# Patient Record
Sex: Male | Born: 1950
Health system: Southern US, Community
[De-identification: ages and names within clinical notes are randomized; demographics above are authoritative.]

## PROBLEM LIST (undated history)

## (undated) DIAGNOSIS — I2510 Atherosclerotic heart disease of native coronary artery without angina pectoris: Secondary | ICD-10-CM

## (undated) DIAGNOSIS — T4145XA Adverse effect of unspecified anesthetic, initial encounter: Secondary | ICD-10-CM

## (undated) DIAGNOSIS — M1990 Unspecified osteoarthritis, unspecified site: Secondary | ICD-10-CM

## (undated) DIAGNOSIS — Z955 Presence of coronary angioplasty implant and graft: Secondary | ICD-10-CM

## (undated) DIAGNOSIS — Z87442 Personal history of urinary calculi: Secondary | ICD-10-CM

## (undated) DIAGNOSIS — I10 Essential (primary) hypertension: Secondary | ICD-10-CM

## (undated) DIAGNOSIS — K5730 Diverticulosis of large intestine without perforation or abscess without bleeding: Secondary | ICD-10-CM

## (undated) DIAGNOSIS — E785 Hyperlipidemia, unspecified: Secondary | ICD-10-CM

## (undated) DIAGNOSIS — K649 Unspecified hemorrhoids: Secondary | ICD-10-CM

## (undated) DIAGNOSIS — I00 Rheumatic fever without heart involvement: Secondary | ICD-10-CM

## (undated) DIAGNOSIS — T8859XA Other complications of anesthesia, initial encounter: Secondary | ICD-10-CM

## (undated) DIAGNOSIS — K219 Gastro-esophageal reflux disease without esophagitis: Secondary | ICD-10-CM

## (undated) DIAGNOSIS — D179 Benign lipomatous neoplasm, unspecified: Secondary | ICD-10-CM

## (undated) DIAGNOSIS — J309 Allergic rhinitis, unspecified: Secondary | ICD-10-CM

## (undated) HISTORY — DX: Atherosclerotic heart disease of native coronary artery without angina pectoris: I25.10

## (undated) HISTORY — PX: TONSILLECTOMY AND ADENOIDECTOMY: SUR1326

## (undated) HISTORY — DX: Hyperlipidemia, unspecified: E78.5

## (undated) HISTORY — DX: Unspecified hemorrhoids: K64.9

## (undated) HISTORY — PX: NASAL SEPTUM SURGERY: SHX37

## (undated) HISTORY — DX: Essential (primary) hypertension: I10

## (undated) HISTORY — DX: Rheumatic fever without heart involvement: I00

## (undated) HISTORY — DX: Diverticulosis of large intestine without perforation or abscess without bleeding: K57.30

## (undated) HISTORY — DX: Allergic rhinitis, unspecified: J30.9

---
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
## 1997-11-13 ENCOUNTER — Other Ambulatory Visit: Admission: RE | Admit: 1997-11-13 | Discharge: 1997-11-13 | Payer: Self-pay | Admitting: *Deleted

## 1998-01-29 ENCOUNTER — Other Ambulatory Visit: Admission: RE | Admit: 1998-01-29 | Discharge: 1998-01-29 | Payer: Self-pay | Admitting: *Deleted

## 1998-02-04 ENCOUNTER — Ambulatory Visit (HOSPITAL_COMMUNITY): Admission: RE | Admit: 1998-02-04 | Discharge: 1998-02-04 | Payer: Self-pay | Admitting: *Deleted

## 1998-02-12 ENCOUNTER — Other Ambulatory Visit: Admission: RE | Admit: 1998-02-12 | Discharge: 1998-02-12 | Payer: Self-pay | Admitting: *Deleted

## 2000-06-28 ENCOUNTER — Ambulatory Visit (HOSPITAL_COMMUNITY): Admission: RE | Admit: 2000-06-28 | Discharge: 2000-06-28 | Payer: Self-pay | Admitting: *Deleted

## 2001-11-21 ENCOUNTER — Encounter: Admission: RE | Admit: 2001-11-21 | Discharge: 2001-11-21 | Payer: Self-pay | Admitting: Family Medicine

## 2001-11-21 ENCOUNTER — Encounter: Payer: Self-pay | Admitting: Family Medicine

## 2002-12-31 ENCOUNTER — Emergency Department (HOSPITAL_COMMUNITY): Admission: EM | Admit: 2002-12-31 | Discharge: 2002-12-31 | Payer: Self-pay | Admitting: Emergency Medicine

## 2002-12-31 ENCOUNTER — Encounter: Payer: Self-pay | Admitting: Emergency Medicine

## 2004-06-10 DIAGNOSIS — K5730 Diverticulosis of large intestine without perforation or abscess without bleeding: Secondary | ICD-10-CM

## 2004-06-10 HISTORY — DX: Diverticulosis of large intestine without perforation or abscess without bleeding: K57.30

## 2004-08-10 ENCOUNTER — Emergency Department (HOSPITAL_COMMUNITY): Admission: EM | Admit: 2004-08-10 | Discharge: 2004-08-10 | Payer: Self-pay | Admitting: Emergency Medicine

## 2004-08-26 ENCOUNTER — Ambulatory Visit (HOSPITAL_COMMUNITY): Admission: RE | Admit: 2004-08-26 | Discharge: 2004-08-26 | Payer: Self-pay | Admitting: Urology

## 2005-06-28 ENCOUNTER — Emergency Department (HOSPITAL_COMMUNITY): Admission: EM | Admit: 2005-06-28 | Discharge: 2005-06-28 | Payer: Self-pay | Admitting: Emergency Medicine

## 2009-05-19 ENCOUNTER — Ambulatory Visit (HOSPITAL_COMMUNITY): Admission: RE | Admit: 2009-05-19 | Discharge: 2009-05-19 | Payer: Self-pay | Admitting: Internal Medicine

## 2009-05-19 ENCOUNTER — Encounter (INDEPENDENT_AMBULATORY_CARE_PROVIDER_SITE_OTHER): Payer: Self-pay | Admitting: *Deleted

## 2010-12-31 NOTE — Consult Note (Signed)
NAME:  [name], George NO.:  [id number]   MEDICAL RECORD NO.:  [id number]          PATIENT TYPE:  EMS   LOCATION:  MAJO                         FACILITY:  [facility]   PHYSICIAN:  Antony Contras, MD     DATE OF BIRTH:  17-Jan-1951   DATE OF CONSULTATION:  06/28/2005  DATE OF DISCHARGE:                                   CONSULTATION   CHIEF COMPLAINT:  Epistaxis.   HISTORY OF PRESENT ILLNESS:  Mr. Roberto George is a 60 year old white male who  underwent septoplasty and turbinate reduction a little over a week ago for  nasal obstruction.  His stents were removed on Friday.  Through the weekend,  he has had several episodes of epistaxis, mainly from the left side, and a  few of them being pretty severe, lasting up to 15 minutes.  He talked to the  doctor on call last night who suggested that he come to the emergency room  to be evaluated.  In the emergency room, he has no specific complaints  except for the bleeding as described.   PAST MEDICAL HISTORY:  Anxiety, high cholesterol, heart murmur.   PAST SURGICAL HISTORY:  As above.   MEDICATIONS:  Lipitor and Zoloft.   ALLERGIES:  Erythromycin.   SOCIAL HISTORY:  Denies alcohol or drug use.  He also denies smoking.  He is  in the emergency department with his wife and daughter.   REVIEW OF SYSTEMS:  Otherwise, negative.   PHYSICAL EXAMINATION:  VITAL SIGNS:  Temperature 97.1, blood pressure 140/90, pulse 76,  respirations 18, saturation 99%.  GENERAL:  The patient is in no acute distress, he is pleasant and  cooperative with dry blood on the face.  HEENT:  Nasal exam, there is no active epistaxis at this time.  Anterior  exam reveals a little bit of clots in the nasal floor on the left side.  Oral cavity and oropharynx exam, there is no active bleeding down the  oropharynx.  The patient has a long soft palate and under bite.  NECK:  Unremarkable with no masses or adenopathy.   PROCEDURE:  The nose was sprayed on both  sides heavily with Afrin nasal  spray.  A nasal speculum was used to evaluate the anterior nose and a  Frazier tip suction used to suction out some of the clot from the left side.  A flexible laryngoscope was then passed into the nasal passages to evaluate  for active bleeding and none was seen.  Additional clot was suctioned out as  well as spit out by the patient.  Evaluation, again, with the flexible scope  revealed no active bleeding.   ASSESSMENT:  Mr. Roberto George is a 60 year old white male who has postoperative  epistaxis following nasal surgery.   PLAN:  Currently, there is no active bleeding and nowhere to cauterize or  pack.  I have recommended that he use Afrin nasal spray four times daily for  three days and saline nasal spray inbetween.  I have instructed him to call  our office right away if he has an active bleed again and  he will be re-  evaluated for possible intervention.  He is stable to be discharged from the  emergency department and understands the instructions.      Antony Contras, MD  Electronically Signed     DDB/MEDQ  D:  06/28/2005  T:  06/28/2005  Job:  [id number]

## 2011-01-04 ENCOUNTER — Other Ambulatory Visit (HOSPITAL_COMMUNITY): Payer: Self-pay | Admitting: Internal Medicine

## 2011-01-04 ENCOUNTER — Ambulatory Visit (HOSPITAL_COMMUNITY)
Admission: RE | Admit: 2011-01-04 | Discharge: 2011-01-04 | Disposition: A | Discharge status: Home / Self Care | Payer: BC Managed Care – PPO | Source: Ambulatory Visit | Attending: Internal Medicine | Admitting: Internal Medicine

## 2011-01-04 DIAGNOSIS — R102 Pelvic and perineal pain: Secondary | ICD-10-CM

## 2011-01-04 DIAGNOSIS — R109 Unspecified abdominal pain: Secondary | ICD-10-CM | POA: Insufficient documentation

## 2011-01-04 DIAGNOSIS — R1031 Right lower quadrant pain: Secondary | ICD-10-CM

## 2012-08-24 ENCOUNTER — Telehealth: Payer: Self-pay | Admitting: Gastroenterology

## 2012-08-24 NOTE — Telephone Encounter (Signed)
Left message for pt to call back  °

## 2012-08-24 NOTE — Telephone Encounter (Signed)
Pt c/o pain from hemorrhoids. Requesting to be seen sooner than 1st available. Pt schedule to see Mike Gip PA 08/28/12@10am . Pt aware of appt date and time.

## 2012-08-27 ENCOUNTER — Encounter: Payer: Self-pay | Admitting: *Deleted

## 2012-08-28 ENCOUNTER — Encounter: Payer: Self-pay | Admitting: Physician Assistant

## 2012-08-28 ENCOUNTER — Ambulatory Visit (INDEPENDENT_AMBULATORY_CARE_PROVIDER_SITE_OTHER): Payer: BC Managed Care – PPO | Admitting: Physician Assistant

## 2012-08-28 VITALS — BP 130/86 | HR 73 | Ht 70.5 in | Wt 190.8 lb

## 2012-08-28 DIAGNOSIS — I10 Essential (primary) hypertension: Secondary | ICD-10-CM

## 2012-08-28 DIAGNOSIS — K648 Other hemorrhoids: Secondary | ICD-10-CM

## 2012-08-28 DIAGNOSIS — K219 Gastro-esophageal reflux disease without esophagitis: Secondary | ICD-10-CM

## 2012-08-28 DIAGNOSIS — K5790 Diverticulosis of intestine, part unspecified, without perforation or abscess without bleeding: Secondary | ICD-10-CM

## 2012-08-28 DIAGNOSIS — K5730 Diverticulosis of large intestine without perforation or abscess without bleeding: Secondary | ICD-10-CM

## 2012-08-28 DIAGNOSIS — E785 Hyperlipidemia, unspecified: Secondary | ICD-10-CM

## 2012-08-28 MED ORDER — LIDOCAINE HCL 3 % EX CREA: TOPICAL_CREAM | CUTANEOUS | Status: DC

## 2012-08-28 MED ORDER — LIDOCAINE HCL 3 % EX CREA: 1.0000 "application " | TOPICAL_CREAM | CUTANEOUS | Status: DC | PRN

## 2012-08-28 NOTE — Patient Instructions (Addendum)
We sent a prescription for Lidamantle cream to [facility].  Get BaLmex cream over the counter, use 2-3 times daily. Use moistened wipes to clean the rectal area after bowel movements.   Follow up with Dr Arlyce Dice on 10-10-2012 at 9:45 Am.

## 2012-08-28 NOTE — Progress Notes (Signed)
Subjective:    Patient ID: Roberto George, male    DOB: [birth year], 62 y.o.   MRN: [id number]  HPI  Roberto George is a very nice 62 year old white male, known remotely to Dr. Arlyce Dice from colonoscopy done in October of 2005. This was done as a screening exam and showed evidence of left-sided diverticulosis and was otherwise a normal exam. He comes back in today with complaints of rectal discomfort and bleeding. He says he has had hemorrhoidal symptoms off and on over the past several years but has been having problems over the past 4-5 months but have been more persistent and and aggravating. He denies any abdominal pain or change in his bowel habits, no problems with constipation or excessive straining. He says he has pain with a bowel movement and says it feels like a piece of glass going through his rectum and times, he is also bothered by external itching and burning and has had frequent bleeding with blood dripping in the commode after a bowel movement . He has been using over-the-counter hydrocortisone cream which is somewhat helpful. He was advised to do tub  soaks but this does not seem to be making any difference .  Family history is negative for colon cancer/ polyps.    Review of Systems  Constitutional: Negative.   HENT: Negative.   Eyes: Negative.   Respiratory: Negative.   Cardiovascular: Negative.   Gastrointestinal: Positive for blood in stool and rectal pain.  Genitourinary: Negative.   Musculoskeletal: Negative.   Neurological: Negative.   Hematological: Negative.   Psychiatric/Behavioral: Negative.    Outpatient Encounter Prescriptions as of 08/28/2012  Medication Sig Dispense Refill  . atorvastatin (LIPITOR) 40 MG tablet Take 40 mg by mouth daily.      . cholecalciferol (VITAMIN D) 400 UNITS TABS Take 400 Units by mouth daily.      . cyclobenzaprine (FLEXERIL) 10 MG tablet Take 10 mg by mouth as needed.      . fluticasone (VERAMYST) 27.5 MCG/SPRAY nasal spray Place 2 sprays  into the nose as needed.      . folic acid (FOLVITE) 800 MCG tablet Take 400 mcg by mouth daily.      Marland Kitchen lisinopril (PRINIVIL,ZESTRIL) 40 MG tablet Take 40 mg by mouth daily.      Marland Kitchen omeprazole (PRILOSEC) 20 MG capsule Take 20 mg by mouth daily.      . sertraline (ZOLOFT) 50 MG tablet Take 50 mg by mouth daily.      Marland Kitchen lidocaine (LINDAMANTLE) 3 % CREA cream Use a small amount internally 2-3 times daily.  1 Tube  3  . [DISCONTINUED] lidocaine (LINDAMANTLE) 3 % CREA cream Apply 1 application topically as needed.  1 Tube  3   Allergies  Allergen Reactions  . Erythromycin      Patient Active Problem List  Diagnosis  . Diverticulosis  . GERD (gastroesophageal reflux disease)  . HTN (hypertension)  . Hyperlipidemia   family history includes Heart attack in his maternal grandmother and mother and Stroke in his father. History  Substance Use Topics  . Smoking status: Former Smoker    Quit date: 08/15/1970  . Smokeless tobacco: Never Used  . Alcohol Use: Yes     Comment: average 4-5 drinks a day - drinks mostly on weekend        Physical Exam  well-developed older white male in no acute distress, pleasant blood pressure 130/86 pulse 73 height 5 foot 10 weight 190. HEENT; nontraumatic normocephalic EOMI PERRLA sclera  anicteric,Neck; Supple no JVD, Cardiovascular; regular rate and rhythm with S1-S2 no murmur gallop, Pulmonary ;clear bilaterally, Abdomen; soft nontender nondistended bowel sounds active no palpable mass or hepatosplenomegaly, Rectal; exam small external hemorrhoidal tag no evidence for candidiasis etc., on anoscopy he has at least one internal hemorrhoid inflamed and small area of ulceration with oozing of heme . Extremities; no clubbing cyanosis or edema skin warm and dry, Psych; mood and affect normal and appropriate.        Assessment & Plan:  #38  62 year old white male with symptomatic internal hemorrhoids manifested with rectal discomfort and intermittent bleeding . #2  colon neoplasia surveillance-patient is up-to-date with colonoscopy, no polyps on initial screening exam and due for followup in 2015  Plan; we'll give him a trial of bulb max/zinc oxide cream for external use 2-3 times daily Moistened tissue Start Analpram 2.5%, patient to use after each bowel movement and then 2-3 times daily regularly over the next 3-4 weeks, and when necessary thereafter He'll followup with Dr. Arlyce Dice in 5-6 weeks if he has not had significant improvement with the above regimen . We briefly discussed hemorrhoidal banding.

## 2012-08-29 NOTE — Progress Notes (Signed)
Reviewed and agree with management. Skilynn Durney D. Aanika Defoor, M.D., FACG  

## 2012-10-10 ENCOUNTER — Ambulatory Visit: Payer: BC Managed Care – PPO | Admitting: Gastroenterology

## 2014-03-14 ENCOUNTER — Encounter (INDEPENDENT_AMBULATORY_CARE_PROVIDER_SITE_OTHER): Payer: Self-pay | Admitting: General Surgery

## 2014-03-20 ENCOUNTER — Ambulatory Visit (INDEPENDENT_AMBULATORY_CARE_PROVIDER_SITE_OTHER): Payer: Medicare Other | Admitting: General Surgery

## 2014-03-20 ENCOUNTER — Encounter (INDEPENDENT_AMBULATORY_CARE_PROVIDER_SITE_OTHER): Payer: Self-pay | Admitting: General Surgery

## 2014-03-20 VITALS — BP 128/78 | HR 66 | Temp 97.2°F | Ht 71.0 in | Wt 192.0 lb

## 2014-03-20 DIAGNOSIS — R229 Localized swelling, mass and lump, unspecified: Secondary | ICD-10-CM

## 2014-03-20 DIAGNOSIS — R222 Localized swelling, mass and lump, trunk: Secondary | ICD-10-CM

## 2014-03-20 DIAGNOSIS — R220 Localized swelling, mass and lump, head: Secondary | ICD-10-CM

## 2014-03-20 NOTE — Progress Notes (Signed)
Subjective:   Mass on the back and on scalp  Patient ID: Roberto George, male   DOB: 01/30/1951, 63 y.o.   MRN: [id number]  HPI Patient is a pleasant 63 year old male referred by Dr. Tonia Brooms for a soft tissue mass on his back. The patient was not aware of the area until recently. He was seeing a chiropractor for neck pain it was pointed out to him that he had a mass on his upper mid back toward the base of the neck. He then was able to feel it. He saw Dr. Tonia Brooms who also noted that he was concerned and he was referred. Based on the size currently he feels that it is probably grown fairly rapidly. He also has a soft lump on the back of his scalp that has been present a couple of years but has definitely enlarged And has become tender  Past Medical History  Diagnosis Date  . Diverticulosis of colon (without mention of hemorrhage) 06/10/2004    Dr Erskine Emery  . Hemorrhoids   . Hypertension   . Hyperlipidemia   . Allergic rhinitis   . Rheumatic fever   . Heart murmur    Past Surgical History  Procedure Laterality Date  . Nasal septum surgery    . Tonsillectomy and adenoidectomy     Current Outpatient Prescriptions  Medication Sig Dispense Refill  . atorvastatin (LIPITOR) 40 MG tablet Take 40 mg by mouth daily.      . cholecalciferol (VITAMIN D) 400 UNITS TABS Take 400 Units by mouth daily.      . cyclobenzaprine (FLEXERIL) 10 MG tablet Take 10 mg by mouth as needed.      . fluticasone (VERAMYST) 27.5 MCG/SPRAY nasal spray Place 2 sprays into the nose as needed.      . folic acid (FOLVITE) 947 MCG tablet Take 400 mcg by mouth daily.      Marland Kitchen lidocaine (LINDAMANTLE) 3 % CREA cream Use a small amount internally 2-3 times daily.  1 Tube  3  . lisinopril (PRINIVIL,ZESTRIL) 40 MG tablet Take 40 mg by mouth daily.      Marland Kitchen omeprazole (PRILOSEC) 20 MG capsule Take 20 mg by mouth daily.      . sertraline (ZOLOFT) 50 MG tablet Take 50 mg by mouth daily.       No current facility-administered  medications for this visit.   Allergies  Allergen Reactions  . Erythromycin    History  Substance Use Topics  . Smoking status: Former Smoker    Quit date: 08/15/1970  . Smokeless tobacco: Never Used  . Alcohol Use: Yes     Comment: average 4-5 drinks a day - drinks mostly on weekend     Review of Systems  Respiratory: Negative.   Cardiovascular: Negative.   Musculoskeletal: Positive for arthralgias, neck pain and neck stiffness.       Objective:   Physical Exam BP 128/78  Pulse 66  Temp(Src) 97.2 F (36.2 C)  Ht 5\' 11"  (1.803 m)  Wt 192 lb (87.091 kg)  BMI 26.79 kg/m2 General: Well-developed male in no distress HEENT: On the posterior scalp near the midline is a 3 cm soft fairly well-defined subcutaneous mass. No other masses. Lymph nodes no cervical, supraclavicular nodes palpable Trunk: On the upper back near the midline is a 7 cm rubbery somewhat firm and deep subcutaneous mass. Lungs: Clear equal breath sounds bilaterally Cardiac: Regular rate and rhythm. No edema Neurologic: Alert and oriented. Gait normal.    Assessment:  Deep subcutaneous masses of the scalp and back, 3 and 7 cm respectively. He is having symptoms and likely some significant recent enlargement. Most likely lipomas but with enlargement of symptoms I think he should be removed, rule out malignant tumor such as liposarcoma. We discussed the nature and indications of the surgery. Risks of general anesthesia, bleeding, infection were discussed and understood and all his questions answered     Plan:     Excision of soft tissue masses of scalp and back under general anesthesia as an outpatient

## 2014-03-20 NOTE — Patient Instructions (Signed)
Lipoma  A lipoma is a noncancerous (benign) tumor composed of fat cells. They are usually found under the skin (subcutaneous). A lipoma may occur in any tissue of the body that contains fat. Common areas for lipomas to appear include the back, shoulders, buttocks, and thighs. Lipomas are a very common soft tissue growth. They are soft and grow slowly. Most problems caused by a lipoma depend on where it is growing.  DIAGNOSIS   A lipoma can be diagnosed with a physical exam. These tumors rarely become cancerous, but radiographic studies can help determine this for certain. Studies used may include:  · Computerized X-ray scans (CT or CAT scan).  · Computerized magnetic scans (MRI).  TREATMENT   Small lipomas that are not causing problems may be watched. If a lipoma continues to enlarge or causes problems, removal is often the best treatment. Lipomas can also be removed to improve appearance. Surgery is done to remove the fatty cells and the surrounding capsule. Most often, this is done with medicine that numbs the area (local anesthetic). The removed tissue is examined under a microscope to make sure it is not cancerous. Keep all follow-up appointments with your caregiver.  SEEK MEDICAL CARE IF:   · The lipoma becomes larger or hard.  · The lipoma becomes painful, red, or increasingly swollen. These could be signs of infection or a more serious condition.  Document Released: 07/22/2002 Document Revised: 10/24/2011 Document Reviewed: 01/01/2010  ExitCare® Patient Information ©2015 [employer]. This information is not intended to replace advice given to you by your health care provider. Make sure you discuss any questions you have with your health care provider.

## 2014-04-03 ENCOUNTER — Encounter: Payer: Self-pay | Admitting: Gastroenterology

## 2014-04-03 NOTE — Progress Notes (Signed)
Surgery 04-08-14, pre op 04-07-14, please put orders in Epic Thanks

## 2014-04-04 ENCOUNTER — Encounter (HOSPITAL_COMMUNITY): Payer: Self-pay | Admitting: Pharmacy Technician

## 2014-04-04 ENCOUNTER — Ambulatory Visit (INDEPENDENT_AMBULATORY_CARE_PROVIDER_SITE_OTHER): Payer: Medicare Other | Admitting: General Surgery

## 2014-04-04 NOTE — Patient Instructions (Addendum)
Roberto George  04/04/2014                           YOUR PROCEDURE IS SCHEDULED ON: 04/08/14               ENTER THRU [facility] MAIN HOSPITAL ENTRANCE AND                            FOLLOW  SIGNS TO SHORT STAY CENTER                 ARRIVE AT SHORT STAY AT: 7:30 AM               CALL THIS NUMBER IF ANY PROBLEMS THE DAY OF SURGERY :               [phone]                                REMEMBER:   Do not eat food or drink liquids AFTER MIDNIGHT              STOP ASPIRIN AND HERBAL MEDS 5 DAYS PREOP                  Take these medicines the morning of surgery with               A SIPS OF WATER :   NEXIUM      Do not wear jewelry, make-up   Do not wear lotions, powders, or perfumes.   Do not shave legs or underarms 12 hrs. before surgery (men may shave face)  Do not bring valuables to the hospital.  Contacts, dentures or bridgework may not be worn into surgery.  Leave suitcase in the car. After surgery it may be brought to your room.  For patients admitted to the hospital more than one night, checkout time is            11:00 AM                                                       The day of discharge.   Patients discharged the day of surgery will not be allowed to drive home.            If going home same day of surgery, must have someone stay with you              FIRST 24 hrs at home and arrange for some one to drive you              home from hospital.   ________________________________________________________________________                                                                        [place]  Before surgery, you can play an important role.  Because skin is not sterile, your skin needs  to be as free of germs as possible.  You can reduce the number of germs on your skin by washing with CHG (chlorahexidine gluconate) soap before surgery.  CHG is an antiseptic cleaner which kills germs and bonds with the skin to  continue killing germs even after washing. Please DO NOT use if you have an allergy to CHG or antibacterial soaps.  If your skin becomes reddened/irritated stop using the CHG and inform your nurse when you arrive at Short Stay. Do not shave (including legs and underarms) for at least 48 hours prior to the first CHG shower.  You may shave your face. Please follow these instructions carefully:   1.  Shower with CHG Soap the night before surgery and the  morning of Surgery.   2.  If you choose to wash your hair, wash your hair first as usual with your  normal  Shampoo.   3.  After you shampoo, rinse your hair and body thoroughly to remove the  shampoo.                                         4.  Use CHG as you would any other liquid soap.  You can apply chg directly  to the skin and wash . Gently wash with scrungie or clean wascloth    5.  Apply the CHG Soap to your body ONLY FROM THE NECK DOWN.   Do not use on open                           Wound or open sores. Avoid contact with eyes, ears mouth and genitals (private parts).                        Genitals (private parts) with your normal soap.              6.  Wash thoroughly, paying special attention to the area where your surgery  will be performed.   7.  Thoroughly rinse your body with warm water from the neck down.   8.  DO NOT shower/wash with your normal soap after using and rinsing off  the CHG Soap .                9.  Pat yourself dry with a clean towel.             10.  Wear clean pajamas.             11.  Place clean sheets on your bed the night of your first shower and do not  sleep with pets.  Day of Surgery : Do not apply any lotions/deodorants the morning of surgery.  Please wear clean clothes to the hospital/surgery center.  FAILURE TO FOLLOW THESE INSTRUCTIONS MAY RESULT IN THE CANCELLATION OF YOUR SURGERY    PATIENT  SIGNATURE_________________________________  ______________________________________________________________________

## 2014-04-07 ENCOUNTER — Other Ambulatory Visit (INDEPENDENT_AMBULATORY_CARE_PROVIDER_SITE_OTHER): Payer: Self-pay | Admitting: General Surgery

## 2014-04-07 ENCOUNTER — Encounter (HOSPITAL_COMMUNITY): Payer: Self-pay

## 2014-04-07 ENCOUNTER — Ambulatory Visit (HOSPITAL_COMMUNITY)
Admission: RE | Admit: 2014-04-07 | Discharge: 2014-04-07 | Disposition: A | Discharge status: Home / Self Care | Payer: Medicare Other | Source: Ambulatory Visit | Attending: Anesthesiology | Admitting: Anesthesiology

## 2014-04-07 ENCOUNTER — Encounter (HOSPITAL_COMMUNITY)
Admission: RE | Admit: 2014-04-07 | Discharge: 2014-04-07 | Disposition: A | Discharge status: Home / Self Care | Payer: Medicare Other | Source: Ambulatory Visit | Attending: General Surgery | Admitting: General Surgery

## 2014-04-07 DIAGNOSIS — Z79899 Other long term (current) drug therapy: Secondary | ICD-10-CM | POA: Diagnosis not present

## 2014-04-07 DIAGNOSIS — R221 Localized swelling, mass and lump, neck: Secondary | ICD-10-CM | POA: Diagnosis present

## 2014-04-07 DIAGNOSIS — D1779 Benign lipomatous neoplasm of other sites: Secondary | ICD-10-CM | POA: Diagnosis not present

## 2014-04-07 DIAGNOSIS — Z87891 Personal history of nicotine dependence: Secondary | ICD-10-CM | POA: Diagnosis not present

## 2014-04-07 DIAGNOSIS — R220 Localized swelling, mass and lump, head: Secondary | ICD-10-CM | POA: Diagnosis present

## 2014-04-07 DIAGNOSIS — K219 Gastro-esophageal reflux disease without esophagitis: Secondary | ICD-10-CM | POA: Diagnosis not present

## 2014-04-07 DIAGNOSIS — R229 Localized swelling, mass and lump, unspecified: Secondary | ICD-10-CM | POA: Diagnosis present

## 2014-04-07 DIAGNOSIS — E785 Hyperlipidemia, unspecified: Secondary | ICD-10-CM | POA: Diagnosis not present

## 2014-04-07 DIAGNOSIS — I10 Essential (primary) hypertension: Secondary | ICD-10-CM | POA: Diagnosis not present

## 2014-04-07 HISTORY — DX: Gastro-esophageal reflux disease without esophagitis: K21.9

## 2014-04-07 HISTORY — DX: Unspecified osteoarthritis, unspecified site: M19.90

## 2014-04-07 HISTORY — DX: Benign lipomatous neoplasm, unspecified: D17.9

## 2014-04-07 HISTORY — DX: Personal history of urinary calculi: Z87.442

## 2014-04-07 HISTORY — DX: Adverse effect of unspecified anesthetic, initial encounter: T41.45XA

## 2014-04-07 HISTORY — DX: Other complications of anesthesia, initial encounter: T88.59XA

## 2014-04-07 LAB — CBC
HCT: 42.9 % (ref 39.0–52.0)
Hemoglobin: 13.7 g/dL (ref 13.0–17.0)
MCH: 28.2 pg (ref 26.0–34.0)
MCHC: 31.9 g/dL (ref 30.0–36.0)
MCV: 88.3 fL (ref 78.0–100.0)
PLATELETS: 184 10*3/uL (ref 150–400)
RBC: 4.86 MIL/uL (ref 4.22–5.81)
RDW: 14 % (ref 11.5–15.5)
WBC: 6.5 10*3/uL (ref 4.0–10.5)

## 2014-04-07 LAB — BASIC METABOLIC PANEL
ANION GAP: 11 (ref 5–15)
BUN: 15 mg/dL (ref 6–23)
CO2: 27 mEq/L (ref 19–32)
Calcium: 9 mg/dL (ref 8.4–10.5)
Chloride: 104 mEq/L (ref 96–112)
Creatinine, Ser: 1.24 mg/dL (ref 0.50–1.35)
GFR calc Af Amer: 69 mL/min — ABNORMAL LOW (ref >90)
GFR, EST NON AFRICAN AMERICAN: 59 mL/min — AB (ref >90)
Glucose, Bld: 81 mg/dL (ref 70–99)
Potassium: 4.3 mEq/L (ref 3.7–5.3)
SODIUM: 142 meq/L (ref 137–147)

## 2014-04-07 NOTE — Progress Notes (Signed)
04/07/14 1317  OBSTRUCTIVE SLEEP APNEA  Have you ever been diagnosed with sleep apnea through a sleep study? No (has had sleep study done 14 yrs ago - no sleep apnea at that time)  Do you snore loudly (loud enough to be heard through closed doors)?  1  Do you often feel tired, fatigued, or sleepy during the daytime? 0  Has anyone observed you stop breathing during your sleep? 0  Do you have, or are you being treated for high blood pressure? 1  BMI more than 35 kg/m2? 0  Age over 55 years old? 1  Neck circumference greater than 40 cm/16 inches? 0  Gender: 1  Obstructive Sleep Apnea Score 4  Score 4 or greater  Results sent to PCP

## 2014-04-08 ENCOUNTER — Encounter (HOSPITAL_COMMUNITY)
Admission: RE | Disposition: A | Discharge status: Home / Self Care | Payer: Self-pay | Source: Ambulatory Visit | Attending: General Surgery

## 2014-04-08 ENCOUNTER — Encounter (HOSPITAL_COMMUNITY): Payer: Self-pay | Admitting: *Deleted

## 2014-04-08 ENCOUNTER — Ambulatory Visit (HOSPITAL_COMMUNITY)
Admission: RE | Admit: 2014-04-08 | Discharge: 2014-04-08 | Disposition: A | Discharge status: Home / Self Care | Payer: Medicare Other | Source: Ambulatory Visit | Attending: General Surgery | Admitting: General Surgery

## 2014-04-08 ENCOUNTER — Ambulatory Visit (HOSPITAL_COMMUNITY): Payer: Medicare Other | Admitting: Anesthesiology

## 2014-04-08 ENCOUNTER — Encounter (HOSPITAL_COMMUNITY): Payer: Medicare Other | Admitting: Anesthesiology

## 2014-04-08 DIAGNOSIS — E785 Hyperlipidemia, unspecified: Secondary | ICD-10-CM | POA: Diagnosis not present

## 2014-04-08 DIAGNOSIS — D1739 Benign lipomatous neoplasm of skin and subcutaneous tissue of other sites: Secondary | ICD-10-CM

## 2014-04-08 DIAGNOSIS — R222 Localized swelling, mass and lump, trunk: Secondary | ICD-10-CM

## 2014-04-08 DIAGNOSIS — Z87891 Personal history of nicotine dependence: Secondary | ICD-10-CM | POA: Insufficient documentation

## 2014-04-08 DIAGNOSIS — I10 Essential (primary) hypertension: Secondary | ICD-10-CM | POA: Diagnosis not present

## 2014-04-08 DIAGNOSIS — D1779 Benign lipomatous neoplasm of other sites: Secondary | ICD-10-CM | POA: Insufficient documentation

## 2014-04-08 DIAGNOSIS — K219 Gastro-esophageal reflux disease without esophagitis: Secondary | ICD-10-CM | POA: Insufficient documentation

## 2014-04-08 DIAGNOSIS — Z79899 Other long term (current) drug therapy: Secondary | ICD-10-CM | POA: Insufficient documentation

## 2014-04-08 HISTORY — PX: MASS EXCISION: SHX2000

## 2014-04-08 SURGERY — EXCISION MASS
Anesthesia: General

## 2014-04-08 MED ORDER — METOCLOPRAMIDE HCL 5 MG/ML IJ SOLN
INTRAMUSCULAR | Status: DC | PRN
  Administered 2014-04-08: 10 mg via INTRAVENOUS

## 2014-04-08 MED ORDER — MIDAZOLAM HCL 2 MG/2ML IJ SOLN
INTRAMUSCULAR | Status: AC
Start: 2014-04-08 — End: 2014-04-08
  Filled 2014-04-08: qty 2

## 2014-04-08 MED ORDER — BUPIVACAINE-EPINEPHRINE 0.25% -1:200000 IJ SOLN
INTRAMUSCULAR | Status: AC
  Filled 2014-04-08: qty 1

## 2014-04-08 MED ORDER — FENTANYL CITRATE 0.05 MG/ML IJ SOLN
INTRAMUSCULAR | Status: DC | PRN
  Administered 2014-04-08 (×3): 50 ug via INTRAVENOUS

## 2014-04-08 MED ORDER — ONDANSETRON HCL 4 MG/2ML IJ SOLN
INTRAMUSCULAR | Status: DC | PRN
  Administered 2014-04-08: 4 mg via INTRAVENOUS

## 2014-04-08 MED ORDER — 0.9 % SODIUM CHLORIDE (POUR BTL) OPTIME
TOPICAL | Status: DC | PRN
  Administered 2014-04-08: 1000 mL

## 2014-04-08 MED ORDER — CEFAZOLIN SODIUM-DEXTROSE 2-3 GM-% IV SOLR
INTRAVENOUS | Status: AC
  Filled 2014-04-08: qty 50

## 2014-04-08 MED ORDER — METOCLOPRAMIDE HCL 5 MG/ML IJ SOLN
INTRAMUSCULAR | Status: AC
  Filled 2014-04-08: qty 2

## 2014-04-08 MED ORDER — CHLORHEXIDINE GLUCONATE 4 % EX LIQD: 1.0000 "application " | Freq: Once | CUTANEOUS | Status: DC

## 2014-04-08 MED ORDER — SUCCINYLCHOLINE CHLORIDE 20 MG/ML IJ SOLN
INTRAMUSCULAR | Status: DC | PRN
  Administered 2014-04-08: 100 mg via INTRAVENOUS

## 2014-04-08 MED ORDER — HYDROCODONE-ACETAMINOPHEN 5-325 MG PO TABS: 1.0000 | ORAL_TABLET | ORAL | Status: DC | PRN

## 2014-04-08 MED ORDER — BACITRACIN-NEOMYCIN-POLYMYXIN 400-5-5000 EX OINT
TOPICAL_OINTMENT | CUTANEOUS | Status: DC | PRN
  Administered 2014-04-08: 1 via TOPICAL

## 2014-04-08 MED ORDER — LACTATED RINGERS IV SOLN: INTRAVENOUS | Status: DC

## 2014-04-08 MED ORDER — PROPOFOL 10 MG/ML IV BOLUS
INTRAVENOUS | Status: AC
  Filled 2014-04-08: qty 20

## 2014-04-08 MED ORDER — HYDROCODONE-ACETAMINOPHEN 5-325 MG PO TABS
1.0000 | ORAL_TABLET | ORAL | Status: DC | PRN
  Administered 2014-04-08: 1 via ORAL
  Filled 2014-04-08: qty 1

## 2014-04-08 MED ORDER — EPHEDRINE SULFATE 50 MG/ML IJ SOLN
INTRAMUSCULAR | Status: AC
  Filled 2014-04-08: qty 1

## 2014-04-08 MED ORDER — LACTATED RINGERS IV SOLN
INTRAVENOUS | Status: DC | PRN
  Administered 2014-04-08 (×2): via INTRAVENOUS

## 2014-04-08 MED ORDER — PROPOFOL 10 MG/ML IV BOLUS
INTRAVENOUS | Status: DC | PRN
  Administered 2014-04-08: 150 mg via INTRAVENOUS
  Administered 2014-04-08: 50 mg via INTRAVENOUS

## 2014-04-08 MED ORDER — ONDANSETRON HCL 4 MG/2ML IJ SOLN
INTRAMUSCULAR | Status: AC
  Filled 2014-04-08: qty 2

## 2014-04-08 MED ORDER — DEXAMETHASONE SODIUM PHOSPHATE 4 MG/ML IJ SOLN
INTRAMUSCULAR | Status: DC | PRN
  Administered 2014-04-08: 10 mg via INTRAVENOUS

## 2014-04-08 MED ORDER — BACITRACIN-NEOMYCIN-POLYMYXIN 400-5-5000 EX OINT
TOPICAL_OINTMENT | CUTANEOUS | Status: AC
  Filled 2014-04-08: qty 1

## 2014-04-08 MED ORDER — DEXAMETHASONE SODIUM PHOSPHATE 10 MG/ML IJ SOLN
INTRAMUSCULAR | Status: AC
  Filled 2014-04-08: qty 1

## 2014-04-08 MED ORDER — FENTANYL CITRATE 0.05 MG/ML IJ SOLN: 25.0000 ug | INTRAMUSCULAR | Status: DC | PRN

## 2014-04-08 MED ORDER — FENTANYL CITRATE 0.05 MG/ML IJ SOLN
INTRAMUSCULAR | Status: AC
  Filled 2014-04-08: qty 5

## 2014-04-08 MED ORDER — CEFAZOLIN SODIUM-DEXTROSE 2-3 GM-% IV SOLR
2.0000 g | INTRAVENOUS | Status: AC
  Administered 2014-04-08: 2 g via INTRAVENOUS

## 2014-04-08 MED ORDER — EPHEDRINE SULFATE 50 MG/ML IJ SOLN
INTRAMUSCULAR | Status: DC | PRN
  Administered 2014-04-08 (×2): 5 mg via INTRAVENOUS
  Administered 2014-04-08: 10 mg via INTRAVENOUS

## 2014-04-08 MED ORDER — BUPIVACAINE-EPINEPHRINE 0.25% -1:200000 IJ SOLN
INTRAMUSCULAR | Status: DC | PRN
  Administered 2014-04-08: 50 mL

## 2014-04-08 SURGICAL SUPPLY — 30 items
BLADE SURG 15 STRL LF DISP TIS (BLADE) IMPLANT
BLADE SURG 15 STRL SS (BLADE)
BLADE SURG ROTATE 9660 (MISCELLANEOUS) ×2 IMPLANT
CANISTER SUCTION 2500CC (MISCELLANEOUS) IMPLANT
CHLORAPREP W/TINT 26ML (MISCELLANEOUS) IMPLANT
COVER SURGICAL LIGHT HANDLE (MISCELLANEOUS) IMPLANT
DERMABOND ADVANCED (GAUZE/BANDAGES/DRESSINGS) ×2
DERMABOND ADVANCED .7 DNX12 (GAUZE/BANDAGES/DRESSINGS) ×2 IMPLANT
DRAPE LAPAROSCOPIC ABDOMINAL (DRAPES) ×2 IMPLANT
DRSG TEGADERM 4X4.75 (GAUZE/BANDAGES/DRESSINGS) ×2 IMPLANT
ELECT REM PT RETURN 9FT ADLT (ELECTROSURGICAL) ×2
ELECTRODE REM PT RTRN 9FT ADLT (ELECTROSURGICAL) ×1 IMPLANT
GAUZE SPONGE 4X4 12PLY STRL (GAUZE/BANDAGES/DRESSINGS) ×2 IMPLANT
GLOVE BIOGEL PI IND STRL 8 (GLOVE) ×1 IMPLANT
GLOVE BIOGEL PI INDICATOR 8 (GLOVE) ×1
GLOVE SURG SS PI 7.5 STRL IVOR (GLOVE) ×2 IMPLANT
GOWN STRL REUS W/TWL LRG LVL3 (GOWN DISPOSABLE) IMPLANT
GOWN STRL REUS W/TWL XL LVL3 (GOWN DISPOSABLE) ×4 IMPLANT
KIT BASIN OR (CUSTOM PROCEDURE TRAY) ×2 IMPLANT
NS IRRIG 1000ML POUR BTL (IV SOLUTION) ×2 IMPLANT
PENCIL BUTTON HOLSTER BLD 10FT (ELECTRODE) IMPLANT
SPONGE LAP 18X18 X RAY DECT (DISPOSABLE) IMPLANT
SUT MNCRL AB 4-0 PS2 18 (SUTURE) ×2 IMPLANT
SUT VIC AB 3-0 SH 18 (SUTURE) ×2 IMPLANT
SYR BULB IRRIGATION 50ML (SYRINGE) IMPLANT
TAPE CLOTH SURG 6X10 WHT LF (GAUZE/BANDAGES/DRESSINGS) ×2 IMPLANT
TOWEL OR 17X26 10 PK STRL BLUE (TOWEL DISPOSABLE) ×2 IMPLANT
TUBE ANAEROBIC SPECIMEN COL (MISCELLANEOUS) IMPLANT
WATER STERILE IRR 1000ML POUR (IV SOLUTION) IMPLANT
YANKAUER SUCT BULB TIP NO VENT (SUCTIONS) IMPLANT

## 2014-04-08 NOTE — Anesthesia Preprocedure Evaluation (Addendum)
Anesthesia Evaluation  Patient identified by MRN, date of birth, ID band Patient awake    Reviewed: Allergy & Precautions, H&P , NPO status , Patient's Chart, lab work & pertinent test results  Airway Mallampati: II TM Distance: >3 FB Neck ROM: full    Dental no notable dental hx. (+) Teeth Intact, Dental Advisory Given   Pulmonary neg pulmonary ROS, former smoker,  breath sounds clear to auscultation  Pulmonary exam normal       Cardiovascular Exercise Tolerance: Good hypertension, Pt. on medications Rhythm:regular Rate:Normal     Neuro/Psych negative neurological ROS  negative psych ROS   GI/Hepatic negative GI ROS, Neg liver ROS, GERD-  Medicated and Controlled,  Endo/Other  negative endocrine ROS  Renal/GU negative Renal ROS  negative genitourinary   Musculoskeletal   Abdominal   Peds  Hematology negative hematology ROS (+)   Anesthesia Other Findings   Reproductive/Obstetrics negative OB ROS                          Anesthesia Physical Anesthesia Plan  ASA: II  Anesthesia Plan: General   Post-op Pain Management:    Induction: Intravenous  Airway Management Planned: Oral ETT  Additional Equipment:   Intra-op Plan:   Post-operative Plan: Extubation in OR  Informed Consent: I have reviewed the patients History and Physical, chart, labs and discussed the procedure including the risks, benefits and alternatives for the proposed anesthesia with the patient or authorized representative who has indicated his/her understanding and acceptance.   Dental Advisory Given  Plan Discussed with: CRNA and Surgeon  Anesthesia Plan Comments:         Anesthesia Quick Evaluation

## 2014-04-08 NOTE — Op Note (Signed)
Preoperative Diagnosis: Scalp and Back Mass  Postoprative Diagnosis: Scalp and Back Mass  Procedure: Procedure(s): EXCISION OF POSTERIOR SCALP MASS AND EXC OF BACK MASS   Surgeon: Excell Seltzer T   Assistants: None  Anesthesia:  General endotracheal anesthesia  Indications: Patient is a 63 year old male who presents with a gradually enlarging symptomatic subcutaneous soft tissue masses of the upper midline back near the base of the neck and the posterior scalp both consistent with lipomas. They measure approximately 6-4 cm respectively. Due to enlargement and symptoms we have elected to proceed with excision. We discussed the surgery and recovery and risks detailed elsewhere.  Procedure Detail:  Patient was brought to the operating room, placed in the supine position on the operating table, and general ventricular anesthesia was induced. He was then carefully positioned and padded in the right lateral decubitus position. The posterior scalp and upper back were widely sterilely prepped and draped. Patient was performed and the procedure verified. A transverse incision was made over the palpable mass in the upper back and dissection carried down through the subcutaneous tissue using cautery. I dissected down onto a fairly firm fibrous mass that was distinct in some areas but somewhat indistinct along other borders. It extended deeply down to the fascia and to the spinous process posteriorly. Using careful blunt and cautery dissection the entire palpable abnormal area was completely excised. It appeared consistent with a fibrous lipoma. Soft tissue was infiltrated with Marcaine. Complete hemostasis was obtained with cautery. Deep soft tissue as close as much possible with interrupted 3-0 Vicryl and the subcutaneous with interrupted 3-0 Vicryl and skin with subcuticular 4-0 Monocryl and Dermabond. The scalp lesion was approached next. I excised an ellipse of scalp overlying the mass and dissection  carried down through the scalp. The ellipse was excised. The mass was a discrete appearing lipoma which was deep to the scalp line against the galea. It was completely excised with cautery and measured approximately 3-1/2-4 cm in diameter. Hemostasis was obtained with cautery. The deeper tissues closed with interrupted 3-0 Vicryl and the scalp with running 3-0 nylon. Sponge needle counts were correct. Sterile dressings were applied patient to recovery in good condition.    Findings: As above  Estimated Blood Loss:  Minimal         Drains: none  Blood Given: none          Specimens: Soft tissue masses, probable lipomas from #1 upper back  #2 posterior scalp        Complications:  * No complications entered in OR log *         Disposition: PACU - hemodynamically stable.         Condition: stable

## 2014-04-08 NOTE — Transfer of Care (Signed)
Immediate Anesthesia Transfer of Care Note  Patient: Roberto George  Procedure(s) Performed: Procedure(s): EXCISION OF POSTERIOR SCALP MASS AND EXC OF BACK MASS (N/A)  Patient Location: PACU  Anesthesia Type:General  Level of Consciousness: Patient easily awoken, sedated, comfortable, cooperative, following commands, responds to stimulation.   Airway & Oxygen Therapy: Patient spontaneously breathing, ventilating well, oxygen via simple oxygen mask.  Post-op Assessment: Report given to PACU RN, vital signs reviewed and stable, moving all extremities.   Post vital signs: Reviewed and stable.  Complications: No apparent anesthesia complications

## 2014-04-08 NOTE — Discharge Instructions (Signed)
Leave bandage on back for 48 hours. May shower in 48 hours. Activity as tolerated. Call as needed for increasing pain, redness, drainage or fever

## 2014-04-08 NOTE — Anesthesia Postprocedure Evaluation (Signed)
  Anesthesia Post-op Note  Patient: Roberto George  Procedure(s) Performed: Procedure(s) (LRB): EXCISION OF POSTERIOR SCALP MASS AND EXC OF BACK MASS (N/A)  Patient Location: PACU  Anesthesia Type: General  Level of Consciousness: awake and alert   Airway and Oxygen Therapy: Patient Spontanous Breathing  Post-op Pain: mild  Post-op Assessment: Post-op Vital signs reviewed, Patient's Cardiovascular Status Stable, Respiratory Function Stable, Patent Airway and No signs of Nausea or vomiting  Last Vitals:  Filed Vitals:   04/08/14 1212  BP: 147/85  Pulse: 76  Temp:   Resp: 16    Post-op Vital Signs: stable   Complications: No apparent anesthesia complications

## 2014-04-08 NOTE — Interval H&P Note (Signed)
History and Physical Interval Note:  04/08/2014 9:06 AM  Roberto George  has presented today for surgery, with the diagnosis of Scalp and Back Mass  The various methods of treatment have been discussed with the patient and family. After consideration of risks, benefits and other options for treatment, the patient has consented to  Procedure(s): EXCISION OF POSTERIOR SCALP MASS AND EXC OF BACK MASS (N/A) as a surgical intervention .  The patient's history has been reviewed, patient examined, no change in status, stable for surgery.  I have reviewed the patient's chart and labs.  Questions were answered to the patient's satisfaction.     Nicolette Gieske T

## 2014-04-08 NOTE — H&P (View-Only) (Signed)
Subjective:   Mass on the back and on scalp  Patient ID: Roberto George, male   DOB: [birth year], 63 y.o.   MRN: [id number]  HPI Patient is a pleasant 63 year old male referred by Dr. Tonia Brooms for a soft tissue mass on his back. The patient was not aware of the area until recently. He was seeing a chiropractor for neck pain it was pointed out to him that he had a mass on his upper mid back toward the base of the neck. He then was able to feel it. He saw Dr. Tonia Brooms who also noted that he was concerned and he was referred. Based on the size currently he feels that it is probably grown fairly rapidly. He also has a soft lump on the back of his scalp that has been present a couple of years but has definitely enlarged And has become tender  Past Medical History  Diagnosis Date  . Diverticulosis of colon (without mention of hemorrhage) 06/10/2004    Dr Erskine Emery  . Hemorrhoids   . Hypertension   . Hyperlipidemia   . Allergic rhinitis   . Rheumatic fever   . Heart murmur    Past Surgical History  Procedure Laterality Date  . Nasal septum surgery    . Tonsillectomy and adenoidectomy     Current Outpatient Prescriptions  Medication Sig Dispense Refill  . atorvastatin (LIPITOR) 40 MG tablet Take 40 mg by mouth daily.      . cholecalciferol (VITAMIN D) 400 UNITS TABS Take 400 Units by mouth daily.      . cyclobenzaprine (FLEXERIL) 10 MG tablet Take 10 mg by mouth as needed.      . fluticasone (VERAMYST) 27.5 MCG/SPRAY nasal spray Place 2 sprays into the nose as needed.      . folic acid (FOLVITE) 563 MCG tablet Take 400 mcg by mouth daily.      Marland Kitchen lidocaine (LINDAMANTLE) 3 % CREA cream Use a small amount internally 2-3 times daily.  1 Tube  3  . lisinopril (PRINIVIL,ZESTRIL) 40 MG tablet Take 40 mg by mouth daily.      Marland Kitchen omeprazole (PRILOSEC) 20 MG capsule Take 20 mg by mouth daily.      . sertraline (ZOLOFT) 50 MG tablet Take 50 mg by mouth daily.       No current facility-administered  medications for this visit.   Allergies  Allergen Reactions  . Erythromycin    History  Substance Use Topics  . Smoking status: Former Smoker    Quit date: 08/15/1970  . Smokeless tobacco: Never Used  . Alcohol Use: Yes     Comment: average 4-5 drinks a day - drinks mostly on weekend     Review of Systems  Respiratory: Negative.   Cardiovascular: Negative.   Musculoskeletal: Positive for arthralgias, neck pain and neck stiffness.       Objective:   Physical Exam BP 128/78  Pulse 66  Temp(Src) 97.2 F (36.2 C)  Ht 5\' 11"  (1.803 m)  Wt 192 lb (87.091 kg)  BMI 26.79 kg/m2 General: Well-developed male in no distress HEENT: On the posterior scalp near the midline is a 3 cm soft fairly well-defined subcutaneous mass. No other masses. Lymph nodes no cervical, supraclavicular nodes palpable Trunk: On the upper back near the midline is a 7 cm rubbery somewhat firm and deep subcutaneous mass. Lungs: Clear equal breath sounds bilaterally Cardiac: Regular rate and rhythm. No edema Neurologic: Alert and oriented. Gait normal.    Assessment:  Deep subcutaneous masses of the scalp and back, 3 and 7 cm respectively. He is having symptoms and likely some significant recent enlargement. Most likely lipomas but with enlargement of symptoms I think he should be removed, rule out malignant tumor such as liposarcoma. We discussed the nature and indications of the surgery. Risks of general anesthesia, bleeding, infection were discussed and understood and all his questions answered     Plan:     Excision of soft tissue masses of scalp and back under general anesthesia as an outpatient

## 2014-04-09 ENCOUNTER — Encounter (HOSPITAL_COMMUNITY): Payer: Self-pay | Admitting: General Surgery

## 2014-04-15 ENCOUNTER — Encounter (INDEPENDENT_AMBULATORY_CARE_PROVIDER_SITE_OTHER): Payer: Medicare Other

## 2014-10-15 ENCOUNTER — Encounter: Payer: Self-pay | Admitting: Gastroenterology

## 2015-03-13 ENCOUNTER — Other Ambulatory Visit: Payer: Self-pay | Admitting: General Surgery

## 2015-05-05 ENCOUNTER — Other Ambulatory Visit: Payer: Self-pay | Admitting: Physician Assistant

## 2015-05-05 ENCOUNTER — Emergency Department (HOSPITAL_COMMUNITY): Payer: Medicare Other

## 2015-05-05 ENCOUNTER — Emergency Department (HOSPITAL_COMMUNITY)
Admission: EM | Admit: 2015-05-05 | Discharge: 2015-05-05 | Disposition: A | Discharge status: Home / Self Care | Payer: Medicare Other | Attending: Emergency Medicine | Admitting: Emergency Medicine

## 2015-05-05 ENCOUNTER — Encounter (HOSPITAL_COMMUNITY): Payer: Self-pay | Admitting: *Deleted

## 2015-05-05 DIAGNOSIS — Z87442 Personal history of urinary calculi: Secondary | ICD-10-CM | POA: Diagnosis not present

## 2015-05-05 DIAGNOSIS — R072 Precordial pain: Secondary | ICD-10-CM

## 2015-05-05 DIAGNOSIS — Z79899 Other long term (current) drug therapy: Secondary | ICD-10-CM | POA: Insufficient documentation

## 2015-05-05 DIAGNOSIS — Z86018 Personal history of other benign neoplasm: Secondary | ICD-10-CM | POA: Diagnosis not present

## 2015-05-05 DIAGNOSIS — K219 Gastro-esophageal reflux disease without esophagitis: Secondary | ICD-10-CM | POA: Diagnosis not present

## 2015-05-05 DIAGNOSIS — Z7951 Long term (current) use of inhaled steroids: Secondary | ICD-10-CM | POA: Insufficient documentation

## 2015-05-05 DIAGNOSIS — Z87891 Personal history of nicotine dependence: Secondary | ICD-10-CM | POA: Diagnosis not present

## 2015-05-05 DIAGNOSIS — E785 Hyperlipidemia, unspecified: Secondary | ICD-10-CM | POA: Insufficient documentation

## 2015-05-05 DIAGNOSIS — R079 Chest pain, unspecified: Secondary | ICD-10-CM | POA: Diagnosis present

## 2015-05-05 DIAGNOSIS — Z7982 Long term (current) use of aspirin: Secondary | ICD-10-CM | POA: Diagnosis not present

## 2015-05-05 DIAGNOSIS — I10 Essential (primary) hypertension: Secondary | ICD-10-CM | POA: Diagnosis not present

## 2015-05-05 DIAGNOSIS — M1990 Unspecified osteoarthritis, unspecified site: Secondary | ICD-10-CM | POA: Diagnosis not present

## 2015-05-05 LAB — CBC
HCT: 40.3 % (ref 39.0–52.0)
Hemoglobin: 12.9 g/dL — ABNORMAL LOW (ref 13.0–17.0)
MCH: 28.3 pg (ref 26.0–34.0)
MCHC: 32 g/dL (ref 30.0–36.0)
MCV: 88.4 fL (ref 78.0–100.0)
PLATELETS: 168 10*3/uL (ref 150–400)
RBC: 4.56 MIL/uL (ref 4.22–5.81)
RDW: 14.2 % (ref 11.5–15.5)
WBC: 4.4 10*3/uL (ref 4.0–10.5)

## 2015-05-05 LAB — BASIC METABOLIC PANEL
ANION GAP: 6 (ref 5–15)
BUN: 13 mg/dL (ref 6–20)
CALCIUM: 9 mg/dL (ref 8.9–10.3)
CO2: 27 mmol/L (ref 22–32)
Chloride: 109 mmol/L (ref 101–111)
Creatinine, Ser: 1.09 mg/dL (ref 0.61–1.24)
GFR calc Af Amer: >60 mL/min (ref >60)
GLUCOSE: 81 mg/dL (ref 65–99)
Potassium: 4.2 mmol/L (ref 3.5–5.1)
Sodium: 142 mmol/L (ref 135–145)

## 2015-05-05 LAB — I-STAT CG4 LACTIC ACID, ED: LACTIC ACID, VENOUS: 0.65 mmol/L (ref 0.5–2.0)

## 2015-05-05 LAB — TROPONIN I: Troponin I: <0.03 ng/mL (ref <0.031)

## 2015-05-05 MED ORDER — NITROGLYCERIN 0.4 MG SL SUBL
0.4000 mg | SUBLINGUAL_TABLET | SUBLINGUAL | Status: DC | PRN
  Filled 2015-05-05: qty 1

## 2015-05-05 NOTE — ED Notes (Signed)
MD at bedside. 

## 2015-05-05 NOTE — ED Notes (Signed)
Due to blood accidentally being clicked off by phlebotomy, order had to be discontinued and reordered.

## 2015-05-05 NOTE — ED Provider Notes (Signed)
CSN: [id number]     Arrival date & time 05/05/15  1218 History   First MD Initiated Contact with Patient 05/05/15 1506     Chief Complaint  Patient presents with  . Chest Pain     (Consider location/radiation/quality/duration/timing/severity/associated sxs/prior Treatment) HPI Comments: The pt is a 64 y/o male with hx of Htn and Cholesterol - good control over both - has had onset of CP 24 hours ago when he was visiting in a nursing home - this has been constant since that time - the pain was more intense in the chest, there was some radiation to the bilateral arms between the shoulders and elbows and a feeling of numbness in that area. He has had these same symptoms in the past with increased gas, this improved a lot last night, he gets better when he lays down, this morning he awoke with a similar feeling in the left side of his chest which was persistent as well as an abnormal feeling in the forearms bilaterally which she states feels like pins and needles. He denies any actual weakness or numbness, he is able to perform fine motor function, he is able to ambulate and has normal speech, vision, coordination. 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 He has no pain with eating, no pain with deep breathing, no fevers chills nausea vomiting coughing or shortness of breath. He has had no recent surgery, trauma, immobilization and has not started any new medications. He denies having any known coronary disease, he did have a stress test over a decade ago which she reports is normal. He does not smoke cigarettes. His mother died in her young 3s, heart attack, his father was in his 62s when he had a stroke and a heart attack. His symptoms are mild at this time.  Patient is a 64 y.o. male presenting with chest pain. The history is provided by the patient.  Chest Pain   Past  Medical History  Diagnosis Date  . Diverticulosis of colon (without mention of hemorrhage) 06/10/2004    Dr Erskine Emery  . Hemorrhoids   . Hypertension   . Hyperlipidemia   . Allergic rhinitis   . Rheumatic fever   . Complication of anesthesia     slow to wake up  . Arthritis   . Multiple lipomas     back and scalp  . GERD (gastroesophageal reflux disease)   . History of kidney stones    Past Surgical History  Procedure Laterality Date  . Nasal septum surgery  >10 yrs ago  . Tonsillectomy and adenoidectomy    . Mass excision N/A 04/08/2014    Procedure: EXCISION OF POSTERIOR SCALP MASS AND EXC OF BACK MASS;  Surgeon: Edward Jolly, MD;  Location: [facility];  Service: General;  Laterality: N/A;   Family History  Problem Relation Age of Onset  . Heart attack Mother   . Heart attack Maternal Grandmother   . Stroke Father    Social History  Substance Use Topics  . Smoking status: Former Smoker    Quit date: 08/15/1970  . Smokeless tobacco: Never Used  . Alcohol Use: Yes     Comment: 12 beers on the weekend    Review of Systems  Cardiovascular: Positive for chest pain.  All other systems reviewed and are negative.     Allergies  Erythromycin and Niacin and related  Home Medications   Prior to Admission medications   Medication Sig Start Date End Date Taking? Authorizing Provider  aspirin EC 81 MG tablet Take 81 mg by mouth daily.   Yes Historical Provider, MD  atorvastatin (LIPITOR) 80 MG tablet Take 40 mg by mouth daily.   Yes Historical Provider, MD  cholecalciferol (VITAMIN D) 1000 UNITS tablet Take 2,000 Units by mouth daily.   Yes Historical Provider, MD  esomeprazole (NEXIUM) 20 MG capsule Take 20 mg by mouth every morning.   Yes Historical Provider, MD  fluticasone (FLONASE) 50 MCG/ACT nasal spray Place 1 spray into both nostrils daily as needed for allergies or rhinitis.   Yes Historical Provider, MD  folic acid (FOLVITE) 716 MCG tablet Take 800 mcg by  mouth daily.    Yes Historical Provider, MD  lisinopril (PRINIVIL,ZESTRIL) 40 MG tablet Take 40 mg by mouth every evening.    Yes Historical Provider, MD  meloxicam (MOBIC) 15 MG tablet Take 15 mg by mouth daily as needed for pain.   Yes Historical Provider, MD  Omega-3 Fatty Acids (FISH OIL) 1000 MG CAPS Take 1,000 mg by mouth daily.   Yes Historical Provider, MD  sertraline (ZOLOFT) 50 MG tablet Take 50 mg by mouth at bedtime.    Yes Historical Provider, MD  vardenafil (LEVITRA) 10 MG tablet Take 10 mg by mouth daily as needed for erectile dysfunction.   Yes Historical Provider, MD  ZETIA 10 MG tablet Take 10 mg by mouth daily. 04/08/15  Yes Historical Provider, MD   BP 141/80 mmHg  Pulse 54  Temp(Src) 97.8 F (36.6 C) (Oral)  Resp 18  SpO2 97% Physical Exam  Constitutional: He appears well-developed and well-nourished. No distress.  HENT:  Head: Normocephalic and atraumatic.  Mouth/Throat: Oropharynx is clear and moist. No oropharyngeal exudate.  Eyes: Conjunctivae and EOM are normal. Pupils are equal, round, and reactive to light. Right eye exhibits no discharge. Left eye exhibits no discharge. No scleral icterus.  Neck: Normal range of motion. Neck supple. No JVD present. No thyromegaly present.  Cardiovascular: Normal rate, regular rhythm, normal heart sounds and intact distal pulses.  Exam reveals no gallop and no friction rub.   No murmur heard. Pulmonary/Chest: Effort normal and breath sounds normal. No respiratory distress. He has no wheezes. He has no rales.  Abdominal: Soft. Bowel sounds are normal. He exhibits no distension and no mass. There is no tenderness.  Musculoskeletal: Normal range of motion. He exhibits no edema or tenderness.  Lymphadenopathy:    He has no cervical adenopathy.  Neurological: He is alert. Coordination normal.  Skin: Skin is warm and dry. No rash noted. No erythema.  Psychiatric: He has a normal mood and affect. His behavior is normal.  Nursing  note and vitals reviewed.   ED Course  Procedures (including critical care time) Labs Review Labs Reviewed  CBC - Abnormal; Notable for the following:    Hemoglobin 12.9 (*)    All other components within normal limits  BASIC METABOLIC PANEL  TROPONIN I  I-STAT CG4 LACTIC ACID, ED    Imaging Review Dg Chest 2 View  05/05/2015   CLINICAL DATA:  Acute chest pain.  EXAM: CHEST  2 VIEW  COMPARISON:  April 07, 2014.  FINDINGS: The heart size and mediastinal contours are within normal limits. Both lungs are clear. No pneumothorax or pleural effusion is noted. The visualized skeletal structures are unremarkable.  IMPRESSION: No active cardiopulmonary disease.  Electronically Signed   By: Marijo Conception, M.D.   On: 05/05/2015 13:36   I have personally reviewed and evaluated these images and lab results as part of my medical decision-making.   EKG Interpretation   Date/Time:  Tuesday May 05 2015 12:28:47 EDT Ventricular Rate:  68 PR Interval:  169 QRS Duration: 74 QT Interval:  381 QTC Calculation: 405 R Axis:   68 Text Interpretation:  Sinus rhythm Nonspecific T abnormalities, lateral  leads since last tracing no significant change Confirmed by MILLER  MD,  BRIAN ([id number]) on 05/05/2015 3:07:46 PM    EKG Interpretation  Date/Time:  Tuesday May 05 2015 16:11:52 EDT Ventricular Rate:  66 PR Interval:  186 QRS Duration: 74 QT Interval:  421 QTC Calculation: 441 R Axis:   78 Text Interpretation:  Sinus rhythm Atrial premature complex ECG OTHERWISE WITHIN NORMAL LIMITS Normal ECG Confirmed by MILLER  MD, BRIAN ([id number]) on 05/05/2015 4:20:05 PM          MDM   Final diagnoses:  Chest pain, unspecified chest pain type    Vital signs, lab work, EKG and chest x-ray are all normal. The patient has been informed of these results, he has been having symptoms for over 12 hours and thus I do not think that myocardial infarction is likely, second set will be ordered, will  discuss with cardiology to try to arrange outpatient stress test.  D/w Cardiology - they will set up for stress test  Has no pain at this time - 2 neg trop adn 2 normal ECG   Pt agreeable to f/u for stress this week - will return if symptoms recure or worsen.  Meds given in ED:  Medications  nitroGLYCERIN (NITROSTAT) SL tablet 0.4 mg (not administered)    New Prescriptions   No medications on file       Noemi Chapel, MD 05/05/15 [id number]

## 2015-05-05 NOTE — ED Notes (Signed)
Pt denies chest pain

## 2015-05-05 NOTE — Discharge Instructions (Signed)
Take a daily aspirin until you follow up with cardiology this week  Please obtain all of your results from medical records or have your doctors office obtain the results - share them with your doctor - you should be seen at your doctors office in the next 2 days. Call today to arrange your follow up. Take the medications as prescribed. Please review all of the medicines and only take them if you do not have an allergy to them. Please be aware that if you are taking birth control pills, taking other prescriptions, ESPECIALLY ANTIBIOTICS may make the birth control ineffective - if this is the case, either do not engage in sexual activity or use alternative methods of birth control such as condoms until you have finished the medicine and your family doctor says it is OK to restart them. If you are on a blood thinner such as COUMADIN, be aware that any other medicine that you take may cause the coumadin to either work too much, or not enough - you should have your coumadin level rechecked in next 7 days if this is the case.  ?  It is also a possibility that you have an allergic reaction to any of the medicines that you have been prescribed - Everybody reacts differently to medications and while MOST people have no trouble with most medicines, you may have a reaction such as nausea, vomiting, rash, swelling, shortness of breath. If this is the case, please stop taking the medicine immediately and contact your physician.  ?  You should return to the ER if you develop severe or worsening symptoms.

## 2015-05-05 NOTE — ED Notes (Signed)
Pt reports intermittent left sided chest pain radiates down both arms x2 weeks, with slight SOB. Pain was worse yesterday. Able to speak in full sentences at present. Denies n/v. Denies blood in stool or urine. Pain 2/10.

## 2015-05-07 ENCOUNTER — Telehealth (HOSPITAL_COMMUNITY): Payer: Self-pay | Admitting: *Deleted

## 2015-05-07 NOTE — Telephone Encounter (Signed)
Left message on voicemail in reference to upcoming appointment scheduled for 05/11/15. Phone number given for a call back so details instructions can be given. Mary J Smith, RN 

## 2015-05-07 NOTE — Telephone Encounter (Signed)
Left message on voicemail in reference to upcoming appointment scheduled for 05/11/15. Phone number given for a call back so details instructions can be given. Roberto George

## 2015-05-08 ENCOUNTER — Encounter: Payer: Self-pay | Admitting: Cardiology

## 2015-05-08 ENCOUNTER — Telehealth (HOSPITAL_COMMUNITY): Payer: Self-pay | Admitting: Radiology

## 2015-05-08 NOTE — Telephone Encounter (Signed)
Patient given detailed instructions per Myocardial Perfusion Study Information Sheet for test on 05/11/2015 at 12:15. Patient notified to arrive 15 minutes early and that it is imperative to arrive on time for appointment to keep from having the test rescheduled.  If you need to cancel or reschedule your appointment, please call the office within 24 hours of your appointment. Failure to do so may result in a cancellation of your appointment, and a $50 no show fee. Patient verbalized understanding. EHK

## 2015-05-11 ENCOUNTER — Ambulatory Visit (HOSPITAL_COMMUNITY): Payer: Medicare Other | Attending: Cardiology

## 2015-05-11 DIAGNOSIS — R072 Precordial pain: Secondary | ICD-10-CM | POA: Diagnosis not present

## 2015-05-11 DIAGNOSIS — E785 Hyperlipidemia, unspecified: Secondary | ICD-10-CM | POA: Diagnosis not present

## 2015-05-11 DIAGNOSIS — R079 Chest pain, unspecified: Secondary | ICD-10-CM | POA: Diagnosis not present

## 2015-05-11 DIAGNOSIS — R002 Palpitations: Secondary | ICD-10-CM | POA: Diagnosis not present

## 2015-05-11 DIAGNOSIS — I10 Essential (primary) hypertension: Secondary | ICD-10-CM | POA: Insufficient documentation

## 2015-05-11 DIAGNOSIS — R0609 Other forms of dyspnea: Secondary | ICD-10-CM | POA: Insufficient documentation

## 2015-05-11 LAB — MYOCARDIAL PERFUSION IMAGING
LV dias vol: 111 mL
LV sys vol: 49 mL
Peak HR: 94 {beats}/min
RATE: 0.27
Rest HR: 63 {beats}/min
SDS: 0
SRS: 0
SSS: 0
TID: 0.94

## 2015-05-11 MED ORDER — TECHNETIUM TC 99M SESTAMIBI GENERIC - CARDIOLITE
32.7000 | Freq: Once | INTRAVENOUS | Status: AC | PRN
  Administered 2015-05-11: 32.7 via INTRAVENOUS

## 2015-05-11 MED ORDER — REGADENOSON 0.4 MG/5ML IV SOLN
0.4000 mg | Freq: Once | INTRAVENOUS | Status: AC
  Administered 2015-05-11: 0.4 mg via INTRAVENOUS

## 2015-05-11 MED ORDER — TECHNETIUM TC 99M SESTAMIBI GENERIC - CARDIOLITE
10.8000 | Freq: Once | INTRAVENOUS | Status: AC | PRN
  Administered 2015-05-11: 11 via INTRAVENOUS

## 2015-05-13 ENCOUNTER — Telehealth: Payer: Self-pay | Admitting: Physician Assistant

## 2015-05-13 NOTE — Telephone Encounter (Signed)
PT AWARE OF MYOVIEW RESULTS./CY 

## 2015-05-13 NOTE — Telephone Encounter (Signed)
New message      Pt want stress test results. Stress test was scheduled by Suanne Marker from the [facility].  Please call

## 2016-05-02 ENCOUNTER — Ambulatory Visit (INDEPENDENT_AMBULATORY_CARE_PROVIDER_SITE_OTHER): Payer: Medicare Other

## 2016-05-02 DIAGNOSIS — R002 Palpitations: Secondary | ICD-10-CM

## 2016-10-26 ENCOUNTER — Ambulatory Visit
Admission: RE | Admit: 2016-10-26 | Discharge: 2016-10-26 | Disposition: A | Discharge status: Home / Self Care | Payer: Medicare Other | Source: Ambulatory Visit | Attending: Otolaryngology | Admitting: Otolaryngology

## 2016-10-26 ENCOUNTER — Other Ambulatory Visit: Payer: Self-pay | Admitting: Otolaryngology

## 2016-10-26 DIAGNOSIS — J322 Chronic ethmoidal sinusitis: Secondary | ICD-10-CM

## 2016-10-26 DIAGNOSIS — J339 Nasal polyp, unspecified: Secondary | ICD-10-CM

## 2016-11-08 ENCOUNTER — Ambulatory Visit
Admission: RE | Admit: 2016-11-08 | Discharge: 2016-11-08 | Disposition: A | Discharge status: Home / Self Care | Payer: Medicare Other | Source: Ambulatory Visit | Attending: Otolaryngology | Admitting: Otolaryngology

## 2016-11-08 ENCOUNTER — Other Ambulatory Visit: Payer: Self-pay | Admitting: Otolaryngology

## 2016-11-08 DIAGNOSIS — J988 Other specified respiratory disorders: Secondary | ICD-10-CM

## 2017-04-03 ENCOUNTER — Ambulatory Visit
Admission: RE | Admit: 2017-04-03 | Discharge: 2017-04-03 | Disposition: A | Discharge status: Home / Self Care | Payer: Medicare Other | Source: Ambulatory Visit | Attending: Family Medicine | Admitting: Family Medicine

## 2017-04-03 ENCOUNTER — Other Ambulatory Visit: Payer: Self-pay | Admitting: Family Medicine

## 2017-04-03 DIAGNOSIS — S161XXA Strain of muscle, fascia and tendon at neck level, initial encounter: Secondary | ICD-10-CM

## 2017-06-12 ENCOUNTER — Other Ambulatory Visit: Payer: Self-pay | Admitting: Family Medicine

## 2017-06-12 DIAGNOSIS — Z136 Encounter for screening for cardiovascular disorders: Secondary | ICD-10-CM

## 2017-06-12 DIAGNOSIS — Z87891 Personal history of nicotine dependence: Secondary | ICD-10-CM

## 2017-06-21 ENCOUNTER — Ambulatory Visit
Admission: RE | Admit: 2017-06-21 | Discharge: 2017-06-21 | Disposition: A | Discharge status: Home / Self Care | Payer: Medicare Other | Source: Ambulatory Visit | Attending: Family Medicine | Admitting: Family Medicine

## 2017-06-21 DIAGNOSIS — Z87891 Personal history of nicotine dependence: Secondary | ICD-10-CM

## 2017-06-21 DIAGNOSIS — Z136 Encounter for screening for cardiovascular disorders: Secondary | ICD-10-CM

## 2019-04-14 NOTE — Progress Notes (Signed)
Cardiology Office Note:   Date:  04/15/2019  NAME:  Roberto George    MRN: [id number] DOB:  10/23/1950   PCP:  Donald Prose, MD  Cardiologist:  Evalina Field, MD  Electrophysiologist:  None   Referring MD: Donald Prose, MD   Chief Complaint  Patient presents with  . Shortness of Breath   History of Present Illness:   Roberto George is a 68 y.o. male with a hx of hypertension, hyperlipidemia who is being seen today for the evaluation of shortness of breath at the request of Donald Prose, MD. who presents for the evaluation of 6 months of shortness of breath.  He reports he gets shortness of breath nearly every week, and is associated with rather strenuous activity.  He reports such as working around the yard in her house.  He is able to do all activities of daily living without any issues.  He seems to have quite a number of steps recorded on a step counter.  He reports infrequent episodes of atypical chest pain.  He reports he can get a chest discomfort described as dull pain.  The pain lasts for about 10 minutes and resolves.  He gets this pain about once a week.  The shortness of breath and chest pain appear to be unrelated.  He reports no exertional chest pain.  He reports some lower extremity swelling after being on his feet all day.  However, he reports no symptoms orthopnea or PND.  He does have a history of allergies that seem to be bothersome, and he is not taking Flonase daily.  Most recent lab work shows LDL of 108 creat 1.01.  He is not diabetic.  He smoked for about 2 years and quit when he was 58 years of age.  His mother died of a heart attack at 40.  His father had strokes.  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  He reports he is going [place] next few [place].  He reports a history of rheumatic fever as a child, but from what I can tell he is had no sequelae of rheumatic heart disease.  Past Medical  History: Past Medical History:  Diagnosis Date  . Allergic rhinitis   . Arthritis   . Complication of anesthesia    slow to wake up  . Diverticulosis of colon (without mention of hemorrhage) 06/10/2004   Dr Erskine Emery  . GERD (gastroesophageal reflux disease)   . Hemorrhoids   . History of kidney stones   . Hyperlipidemia   . Hypertension   . Multiple lipomas    back and scalp  . Rheumatic fever     Past Surgical History: Past Surgical History:  Procedure Laterality Date  . MASS EXCISION N/A 04/08/2014   Procedure: EXCISION OF POSTERIOR SCALP MASS AND EXC OF BACK MASS;  Surgeon: Edward Jolly, MD;  Location: [facility];  Service: General;  Laterality: N/A;  . NASAL SEPTUM SURGERY  >10 yrs ago  . TONSILLECTOMY AND ADENOIDECTOMY      Current Medications: Current Meds  Medication Sig  . atorvastatin (LIPITOR) 80 MG tablet Take 40 mg by mouth daily.  . cholecalciferol (VITAMIN D) 1000 UNITS tablet Take 2,000 Units by mouth daily.  Marland Kitchen esomeprazole (NEXIUM) 20 MG capsule Take 20 mg by mouth every morning.  . fluticasone (FLONASE) 50 MCG/ACT nasal spray Place 1 spray into both  nostrils daily as needed for allergies or rhinitis.  . folic acid (FOLVITE) Q000111Q MCG tablet Take 800 mcg by mouth daily.   Marland Kitchen lisinopril (PRINIVIL,ZESTRIL) 40 MG tablet Take 40 mg by mouth every evening.   . meloxicam (MOBIC) 15 MG tablet Take 15 mg by mouth daily as needed for pain.  Marland Kitchen sertraline (ZOLOFT) 50 MG tablet Take 50 mg by mouth at bedtime.   Marland Kitchen ZETIA 10 MG tablet Take 10 mg by mouth daily.     Allergies:    Erythromycin and Niacin and related   Social History: Social History   Socioeconomic History  . Marital status: Married    Spouse name: Not on file  . Number of children: 2  . Years of education: Not on file  . Highest education level: Not on file  Occupational History  . Occupation: retired  Scientific laboratory technician  . Financial resource strain: Not on file  . Food insecurity    Worry: Not  on file    Inability: Not on file  . Transportation needs    Medical: Not on file    Non-medical: Not on file  Tobacco Use  . Smoking status: Former Smoker    Quit date: 08/15/1970    Years since quitting: 48.6  . Smokeless tobacco: Never Used  Substance and Sexual Activity  . Alcohol use: Yes    Comment: 12 beers on the weekend  . Drug use: No  . Sexual activity: Not on file  Lifestyle  . Physical activity    Days per week: Not on file    Minutes per session: Not on file  . Stress: Not on file  Relationships  . Social Herbalist on phone: Not on file    Gets together: Not on file    Attends religious service: Not on file    Active member of club or organization: Not on file    Attends meetings of clubs or organizations: Not on file    Relationship status: Not on file  Other Topics Concern  . Not on file  Social History Narrative  . Not on file     Family History: The patient's family history includes Heart attack in his maternal grandmother and mother; Stroke in his father.  ROS:   All other ROS reviewed and negative. Pertinent positives noted in the HPI.     EKGs/Labs/Other Studies Reviewed:   The following studies were personally reviewed by me today:  Labs from primary care physician: Total cholesterol 187, HDL 45, LDL 108, serum creatinine 1.01, TSH 1.7  EKG:  EKG is ordered today.  The ekg ordered today demonstrates normal sinus rhythm, heart rate 76, normal intervals, no ST-T changes to suggest prior infarction, no acute ST changes suggest ischemia, 1 PVC noted, and was personally reviewed by me.   Recent Labs: No results found for requested labs within last 8760 hours.   Recent Lipid Panel No results found for: CHOL, TRIG, HDL, CHOLHDL, VLDL, LDLCALC, LDLDIRECT  Physical Exam:   VS:  BP (!) 155/95   Pulse 82   Temp 98.4 F (36.9 C)   Ht 5\' 11"  (1.803 m)   Wt 193 lb 6.4 oz (87.7 kg)   SpO2 97%   BMI 26.97 kg/m    Wt Readings from Last 3  Encounters:  04/15/19 193 lb 6.4 oz (87.7 kg)  05/11/15 196 lb (88.9 kg)  04/08/14 196 lb (88.9 kg)    General: Well nourished, well developed, in no  acute distress Heart: Atraumatic, normal size  Eyes: PEERLA, EOMI  Neck: Supple, no JVD Endocrine: No thryomegaly Cardiac: Normal S1, S2; RRR; no murmurs, rubs, or gallops Lungs: Clear to auscultation bilaterally, no wheezing, rhonchi or rales  Abd: Soft, nontender, no hepatomegaly  Ext: No edema, pulses 2+ Musculoskeletal: No deformities, BUE and BLE strength normal and equal Skin: Warm and dry, no rashes   Neuro: Alert and oriented to person, place, time, and situation, CNII-XII grossly intact, no focal deficits  Psych: Normal mood and affect   ASSESSMENT:   NAME@ is a 68 y.o. male who presents for the following: 1. SOB (shortness of breath) on exertion   2. Essential hypertension   3. Mixed hyperlipidemia     PLAN:   1. SOB (shortness of breath) on exertion -He presents with 6 months of worsening shortness of breath.  He also has atypical infrequent chest pain.  He does have CV risk factors of high blood pressure and hyperlipidemia.  He had a normal nuclear medicine stress perfusion study in 2016.  His symptoms possibly related to it.  Of inactivity over the past 6 months.  However, given his recent decline in functional status I think it is reasonable proceed with a repeat [place] MPI study, as well as a transthoracic echocardiogram.  We will try to obtain this over the next week as he plans to begin work at [employer] next week.  2. Essential hypertension -BP elevated today, but appears to have had an episode of anxiety this morning.  We will monitor this for now  3. Mixed hyperlipidemia -Most recent LDL 108   Disposition: Return in about 6 months (around 10/13/2019).  Medication Adjustments/Labs and Tests Ordered: Current medicines are reviewed at length with the patient today.  Concerns regarding medicines are outlined  above.  Orders Placed This Encounter  Procedures  . Myocardial Perfusion Imaging  . EKG 12-Lead  . ECHOCARDIOGRAM COMPLETE   No orders of the defined types were placed in this encounter.   Patient Instructions  Medication Instructions:  Continue same medications   Lab work: None ordered  Testing/Procedures: Schedule Lexiscan Myoview  Schedule Echo  Follow-Up: At [facility], you and your health needs are our priority.  As part of our continuing mission to provide you with exceptional heart care, we have created designated Provider Care Teams.  These Care Teams include your primary Cardiologist (physician) and Advanced Practice Providers (APPs -  Physician Assistants and Nurse Practitioners) who all work together to provide you with the care you need, when you need it. . Schedule follow up appointment in 6 months   Call in Nov to schedule Feb appointment      Signed, Lake Bells T. Audie Box, [place]  [address], [place], [place] [address] [phone]  04/15/2019 9:09 AM

## 2019-04-14 NOTE — H&P (View-Only) (Signed)
Cardiology Office Note:   Date:  04/15/2019  NAME:  Roberto George    MRN: [id number] DOB:  03-Jul-1951   PCP:  Donald Prose, MD  Cardiologist:  Evalina Field, MD  Electrophysiologist:  None   Referring MD: Donald Prose, MD   Chief Complaint  Patient presents with  . Shortness of Breath   History of Present Illness:   Roberto George is a 68 y.o. male with a hx of hypertension, hyperlipidemia who is being seen today for the evaluation of shortness of breath at the request of Donald Prose, MD. who presents for the evaluation of 6 months of shortness of breath.  He reports he gets shortness of breath nearly every week, and is associated with rather strenuous activity.  He reports such as working around the yard in her house.  He is able to do all activities of daily living without any issues.  He seems to have quite a number of steps recorded on a step counter.  He reports infrequent episodes of atypical chest pain.  He reports he can get a chest discomfort described as dull pain.  The pain lasts for about 10 minutes and resolves.  He gets this pain about once a week.  The shortness of breath and chest pain appear to be unrelated.  He reports no exertional chest pain.  He reports some lower extremity swelling after being on his feet all day.  However, he reports no symptoms orthopnea or PND.  He does have a history of allergies that seem to be bothersome, and he is not taking Flonase daily.  Most recent lab work shows LDL of 108 creat 1.01.  He is not diabetic.  He smoked for about 2 years and quit when he was 65 years of age.  His mother died of a heart attack at 48.  His father had strokes.  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  He reports he is going to begin [place] over the next few weeks.  He reports a history of rheumatic fever as a child, but from what I can tell he is had no sequelae of rheumatic heart disease.  Past Medical  History: Past Medical History:  Diagnosis Date  . Allergic rhinitis   . Arthritis   . Complication of anesthesia    slow to wake up  . Diverticulosis of colon (without mention of hemorrhage) 06/10/2004   Dr Erskine Emery  . GERD (gastroesophageal reflux disease)   . Hemorrhoids   . History of kidney stones   . Hyperlipidemia   . Hypertension   . Multiple lipomas    back and scalp  . Rheumatic fever     Past Surgical History: Past Surgical History:  Procedure Laterality Date  . MASS EXCISION N/A 04/08/2014   Procedure: EXCISION OF POSTERIOR SCALP MASS AND EXC OF BACK MASS;  Surgeon: Edward Jolly, MD;  Location: [facility];  Service: General;  Laterality: N/A;  . NASAL SEPTUM SURGERY  >10 yrs ago  . TONSILLECTOMY AND ADENOIDECTOMY      Current Medications: Current Meds  Medication Sig  . atorvastatin (LIPITOR) 80 MG tablet Take 40 mg by mouth daily.  . cholecalciferol (VITAMIN D) 1000 UNITS tablet Take 2,000 Units by mouth daily.  Marland Kitchen esomeprazole (NEXIUM) 20 MG capsule Take 20 mg by mouth every morning.  . fluticasone (FLONASE) 50 MCG/ACT nasal spray Place 1 spray into both  nostrils daily as needed for allergies or rhinitis.  . folic acid (FOLVITE) Q000111Q MCG tablet Take 800 mcg by mouth daily.   Marland Kitchen lisinopril (PRINIVIL,ZESTRIL) 40 MG tablet Take 40 mg by mouth every evening.   . meloxicam (MOBIC) 15 MG tablet Take 15 mg by mouth daily as needed for pain.  Marland Kitchen sertraline (ZOLOFT) 50 MG tablet Take 50 mg by mouth at bedtime.   Marland Kitchen ZETIA 10 MG tablet Take 10 mg by mouth daily.     Allergies:    Erythromycin and Niacin and related   Social History: Social History   Socioeconomic History  . Marital status: Married    Spouse name: Not on file  . Number of children: 2  . Years of education: Not on file  . Highest education level: Not on file  Occupational History  . Occupation: retired  Scientific laboratory technician  . Financial resource strain: Not on file  . Food insecurity    Worry: Not  on file    Inability: Not on file  . Transportation needs    Medical: Not on file    Non-medical: Not on file  Tobacco Use  . Smoking status: Former Smoker    Quit date: 08/15/1970    Years since quitting: 48.6  . Smokeless tobacco: Never Used  Substance and Sexual Activity  . Alcohol use: Yes    Comment: 12 beers on the weekend  . Drug use: No  . Sexual activity: Not on file  Lifestyle  . Physical activity    Days per week: Not on file    Minutes per session: Not on file  . Stress: Not on file  Relationships  . Social Herbalist on phone: Not on file    Gets together: Not on file    Attends religious service: Not on file    Active member of club or organization: Not on file    Attends meetings of clubs or organizations: Not on file    Relationship status: Not on file  Other Topics Concern  . Not on file  Social History Narrative  . Not on file     Family History: The patient's family history includes Heart attack in his maternal grandmother and mother; Stroke in his father.  ROS:   All other ROS reviewed and negative. Pertinent positives noted in the HPI.     EKGs/Labs/Other Studies Reviewed:   The following studies were personally reviewed by me today:  Labs from primary care physician: Total cholesterol 187, HDL 45, LDL 108, serum creatinine 1.01, TSH 1.7  EKG:  EKG is ordered today.  The ekg ordered today demonstrates normal sinus rhythm, heart rate 76, normal intervals, no ST-T changes to suggest prior infarction, no acute ST changes suggest ischemia, 1 PVC noted, and was personally reviewed by me.   Recent Labs: No results found for requested labs within last 8760 hours.   Recent Lipid Panel No results found for: CHOL, TRIG, HDL, CHOLHDL, VLDL, LDLCALC, LDLDIRECT  Physical Exam:   VS:  BP (!) 155/95   Pulse 82   Temp 98.4 F (36.9 C)   Ht 5\' 11"  (1.803 m)   Wt 193 lb 6.4 oz (87.7 kg)   SpO2 97%   BMI 26.97 kg/m    Wt Readings from Last 3  Encounters:  04/15/19 193 lb 6.4 oz (87.7 kg)  05/11/15 196 lb (88.9 kg)  04/08/14 196 lb (88.9 kg)    General: Well nourished, well developed, in no  acute distress Heart: Atraumatic, normal size  Eyes: PEERLA, EOMI  Neck: Supple, no JVD Endocrine: No thryomegaly Cardiac: Normal S1, S2; RRR; no murmurs, rubs, or gallops Lungs: Clear to auscultation bilaterally, no wheezing, rhonchi or rales  Abd: Soft, nontender, no hepatomegaly  Ext: No edema, pulses 2+ Musculoskeletal: No deformities, BUE and BLE strength normal and equal Skin: Warm and dry, no rashes   Neuro: Alert and oriented to person, place, time, and situation, CNII-XII grossly intact, no focal deficits  Psych: Normal mood and affect   ASSESSMENT:   NAME@ is a 68 y.o. male who presents for the following: 1. SOB (shortness of breath) on exertion   2. Essential hypertension   3. Mixed hyperlipidemia     PLAN:   1. SOB (shortness of breath) on exertion -He presents with 6 months of worsening shortness of breath.  He also has atypical infrequent chest pain.  He does have CV risk factors of high blood pressure and hyperlipidemia.  He had a normal nuclear medicine stress perfusion study in 2016.  His symptoms possibly related to it.  Of inactivity over the past 6 months.  However, given his recent decline in functional status I think it is reasonable proceed with a repeat Echo MPI study, as well as a transthoracic echocardiogram.  We will try to obtain this over the next week as he plans to begin work at [employer] next week.  2. Essential hypertension -BP elevated today, but appears to have had an episode of anxiety this morning.  We will monitor this for now  3. Mixed hyperlipidemia -Most recent LDL 108   Disposition: Return in about 6 months (around 10/13/2019).  Medication Adjustments/Labs and Tests Ordered: Current medicines are reviewed at length with the patient today.  Concerns regarding medicines are outlined  above.  Orders Placed This Encounter  Procedures  . Myocardial Perfusion Imaging  . EKG 12-Lead  . ECHOCARDIOGRAM COMPLETE   No orders of the defined types were placed in this encounter.   Patient Instructions  Medication Instructions:  Continue same medications   Lab work: None ordered  Testing/Procedures: Schedule Lexiscan Myoview  Schedule Echo  Follow-Up: At [facility], you and your health needs are our priority.  As part of our continuing mission to provide you with exceptional heart care, we have created designated Provider Care Teams.  These Care Teams include your primary Cardiologist (physician) and Advanced Practice Providers (APPs -  Physician Assistants and Nurse Practitioners) who all work together to provide you with the care you need, when you need it. . Schedule follow up appointment in 6 months   Call in Nov to schedule Feb appointment      Signed, Lake Bells T. Audie Box, [place]  [address] [place], [place] [address] [phone]  04/15/2019 9:09 AM

## 2019-04-15 ENCOUNTER — Ambulatory Visit (INDEPENDENT_AMBULATORY_CARE_PROVIDER_SITE_OTHER): Payer: Medicare Other | Admitting: Cardiovascular Disease

## 2019-04-15 ENCOUNTER — Encounter: Payer: Self-pay | Admitting: Cardiovascular Disease

## 2019-04-15 ENCOUNTER — Other Ambulatory Visit: Payer: Self-pay

## 2019-04-15 VITALS — BP 155/95 | HR 82 | Temp 98.4°F | Ht 71.0 in | Wt 193.4 lb

## 2019-04-15 DIAGNOSIS — I10 Essential (primary) hypertension: Secondary | ICD-10-CM

## 2019-04-15 DIAGNOSIS — E782 Mixed hyperlipidemia: Secondary | ICD-10-CM | POA: Diagnosis not present

## 2019-04-15 DIAGNOSIS — R0602 Shortness of breath: Secondary | ICD-10-CM | POA: Diagnosis not present

## 2019-04-15 NOTE — Patient Instructions (Signed)
Medication Instructions:  Continue same medications   Lab work: None ordered  Testing/Procedures: Schedule Lexiscan Myoview  Schedule Echo  Follow-Up: At [facility], you and your health needs are our priority.  As part of our continuing mission to provide you with exceptional heart care, we have created designated Provider Care Teams.  These Care Teams include your primary Cardiologist (physician) and Advanced Practice Providers (APPs -  Physician Assistants and Nurse Practitioners) who all work together to provide you with the care you need, when you need it. . Schedule follow up appointment in 6 months   Call in Nov to schedule Feb appointment

## 2019-04-19 ENCOUNTER — Ambulatory Visit (HOSPITAL_COMMUNITY): Payer: Medicare Other | Attending: Cardiovascular Disease

## 2019-04-19 ENCOUNTER — Other Ambulatory Visit: Payer: Self-pay

## 2019-04-19 DIAGNOSIS — I10 Essential (primary) hypertension: Secondary | ICD-10-CM | POA: Diagnosis not present

## 2019-04-19 DIAGNOSIS — R0602 Shortness of breath: Secondary | ICD-10-CM | POA: Diagnosis present

## 2019-04-19 DIAGNOSIS — E782 Mixed hyperlipidemia: Secondary | ICD-10-CM | POA: Insufficient documentation

## 2019-04-23 ENCOUNTER — Telehealth (HOSPITAL_COMMUNITY): Payer: Self-pay | Admitting: *Deleted

## 2019-04-23 NOTE — Telephone Encounter (Signed)
Close encounter 

## 2019-04-25 ENCOUNTER — Ambulatory Visit (HOSPITAL_COMMUNITY)
Admission: RE | Admit: 2019-04-25 | Discharge: 2019-04-25 | Disposition: A | Discharge status: Home / Self Care | Payer: Medicare Other | Source: Ambulatory Visit | Attending: Cardiology | Admitting: Cardiology

## 2019-04-25 ENCOUNTER — Other Ambulatory Visit: Payer: Self-pay

## 2019-04-25 DIAGNOSIS — E782 Mixed hyperlipidemia: Secondary | ICD-10-CM | POA: Insufficient documentation

## 2019-04-25 DIAGNOSIS — R0602 Shortness of breath: Secondary | ICD-10-CM | POA: Diagnosis present

## 2019-04-25 DIAGNOSIS — I10 Essential (primary) hypertension: Secondary | ICD-10-CM | POA: Diagnosis present

## 2019-04-25 LAB — MYOCARDIAL PERFUSION IMAGING
LV dias vol: 126 mL (ref 62–150)
LV sys vol: 68 mL
Peak HR: 89 {beats}/min
Rest HR: 55 {beats}/min
SDS: 3
SRS: 2
SSS: 5
TID: 1.2

## 2019-04-25 MED ORDER — TECHNETIUM TC 99M TETROFOSMIN IV KIT
29.0000 | PACK | Freq: Once | INTRAVENOUS | Status: AC | PRN
  Administered 2019-04-25: 29 via INTRAVENOUS
  Filled 2019-04-25: qty 29

## 2019-04-25 MED ORDER — REGADENOSON 0.4 MG/5ML IV SOLN
0.4000 mg | Freq: Once | INTRAVENOUS | Status: AC
  Administered 2019-04-25: 0.4 mg via INTRAVENOUS

## 2019-04-25 MED ORDER — TECHNETIUM TC 99M TETROFOSMIN IV KIT
9.4000 | PACK | Freq: Once | INTRAVENOUS | Status: AC | PRN
  Administered 2019-04-25: 9.4 via INTRAVENOUS
  Filled 2019-04-25: qty 10

## 2019-04-30 ENCOUNTER — Telehealth: Payer: Self-pay | Admitting: Cardiovascular Disease

## 2019-04-30 ENCOUNTER — Telehealth: Payer: Self-pay | Admitting: *Deleted

## 2019-04-30 ENCOUNTER — Other Ambulatory Visit: Payer: Self-pay | Admitting: *Deleted

## 2019-04-30 ENCOUNTER — Inpatient Hospital Stay (HOSPITAL_COMMUNITY): Admission: RE | Admit: 2019-04-30 | Payer: Medicare Other | Source: Ambulatory Visit

## 2019-04-30 DIAGNOSIS — R9439 Abnormal result of other cardiovascular function study: Secondary | ICD-10-CM

## 2019-04-30 DIAGNOSIS — Z01818 Encounter for other preprocedural examination: Secondary | ICD-10-CM

## 2019-04-30 DIAGNOSIS — R0602 Shortness of breath: Secondary | ICD-10-CM

## 2019-04-30 NOTE — Telephone Encounter (Signed)
Discussed with Roberto George that his stress test was abnormal. He is still having symptoms. I have recommended cardiac cath to define his anatomy. We will proceed with this.   Evalina Field, MD

## 2019-04-30 NOTE — Telephone Encounter (Signed)
Left message for patient to call back to discuss cardiac cath - for next week  If available -  Will need labs and covid test   Depending on availability can do either  sept 22- tues.,  sept 104 - wed., or  sept 24 - thur  with Dr Ellyn Hack      ( I called and discussed his test with him. Can we set him up with a cath with Dr. Ellyn Hack? He is wanting this done as soon as we can.   Evalina Field, MD)

## 2019-04-30 NOTE — Telephone Encounter (Signed)
New Message    Patient calling for stress test results. 

## 2019-04-30 NOTE — Telephone Encounter (Signed)
called patient  To discuss and arrange cardiac cath  will start a  New telephone message

## 2019-04-30 NOTE — Telephone Encounter (Signed)
Patient is aware awaiting for result of myoview ,  Will be release to mychart and Rn will contact him will results

## 2019-04-30 NOTE — Telephone Encounter (Signed)
Please advise on stress test results, I did not see them resulted by MD at this time.  Will route to make aware. Thank you!

## 2019-04-30 NOTE — Telephone Encounter (Signed)
SPOKE TO PATIENT - INSTRUCTION GIVEN AND DIRECTION AND LETTER SENT VIA [place] TEST SCHEDULE 05/03/19 AT 10:20 AM    PATIENT VOICED UNDERSTANDING AND AWARE IF ANY QUESTION A RAISE  TO CONTACT OFFICE.

## 2019-04-30 NOTE — Telephone Encounter (Signed)
Spoke to patient - aware needing a cardiac cath .  patient would like to have cardiac cath schedule for Tuesday 05/07/19 - will need labs and covid test Friday 05/03/19  Then self isolate until Tuesday 05/07/19.   RN  Informed patient will contact lab and arrange procedure and contact him back with details. Patient voiced understanding.

## 2019-04-30 NOTE — Telephone Encounter (Signed)
I called and discussed his test with him. Can we set him up with a cath with Dr. Ellyn Hack? He is wanting this done as soon as we can.   Evalina Field, MD

## 2019-05-01 NOTE — Telephone Encounter (Signed)
See other note

## 2019-05-02 NOTE — Telephone Encounter (Signed)
Follow up   Patient's wife states that she will be quarantined also. Does she have to take a covid test also? Please call to discuss.

## 2019-05-02 NOTE — Telephone Encounter (Signed)
SPOKE TO PATIENT , NO HIS WIFE DOES NOT NEED COVID TEST - AS LONG SHE QUARANTINES WITH HIM  VERBALIZED UNDERSTANDING

## 2019-05-03 ENCOUNTER — Other Ambulatory Visit (HOSPITAL_COMMUNITY)
Admission: RE | Admit: 2019-05-03 | Discharge: 2019-05-03 | Disposition: A | Discharge status: Home / Self Care | Payer: Medicare Other | Source: Ambulatory Visit | Attending: Cardiology | Admitting: Cardiology

## 2019-05-03 DIAGNOSIS — Z20828 Contact with and (suspected) exposure to other viral communicable diseases: Secondary | ICD-10-CM | POA: Insufficient documentation

## 2019-05-03 DIAGNOSIS — Z01812 Encounter for preprocedural laboratory examination: Secondary | ICD-10-CM | POA: Diagnosis present

## 2019-05-03 LAB — CBC
Hematocrit: 40.2 % (ref 37.5–51.0)
Hemoglobin: 13.4 g/dL (ref 13.0–17.7)
MCH: 29.3 pg (ref 26.6–33.0)
MCHC: 33.3 g/dL (ref 31.5–35.7)
MCV: 88 fL (ref 79–97)
Platelets: 165 10*3/uL (ref 150–450)
RBC: 4.57 x10E6/uL (ref 4.14–5.80)
RDW: 12.8 % (ref 11.6–15.4)
WBC: 4.5 10*3/uL (ref 3.4–10.8)

## 2019-05-03 LAB — BASIC METABOLIC PANEL
BUN/Creatinine Ratio: 11 (ref 10–24)
BUN: 13 mg/dL (ref 8–27)
CO2: 25 mmol/L (ref 20–29)
Calcium: 9.7 mg/dL (ref 8.6–10.2)
Chloride: 104 mmol/L (ref 96–106)
Creatinine, Ser: 1.17 mg/dL (ref 0.76–1.27)
GFR calc Af Amer: 73 mL/min/{1.73_m2} (ref >59)
GFR calc non Af Amer: 63 mL/min/{1.73_m2} (ref >59)
Glucose: 86 mg/dL (ref 65–99)
Potassium: 4.8 mmol/L (ref 3.5–5.2)
Sodium: 141 mmol/L (ref 134–144)

## 2019-05-04 LAB — NOVEL CORONAVIRUS, NAA (HOSP ORDER, SEND-OUT TO REF LAB; TAT 18-24 HRS): SARS-CoV-2, NAA: NOT DETECTED

## 2019-05-06 ENCOUNTER — Telehealth: Payer: Self-pay | Admitting: *Deleted

## 2019-05-06 NOTE — Telephone Encounter (Signed)
Pt contacted pre-catheterization scheduled at [facility] for: Tuesday May 07, 2019 7:30 AM Verified arrival time and place: [facility]) at: 5:30 AM   No solid food after midnight prior to cath, clear liquids until 5 AM day of procedure. Contrast allergy: no   AM meds can be  taken pre-cath with sip of water including: ASA 81 mg   Confirmed patient has responsible person to drive home post procedure and observe 24 hours after arriving home: yes  Currently, due to Covid-19 pandemic, only one support person will be allowed with patient. Must be the same support person for that patient's entire stay, will be screened and required to wear a mask. They will be asked to wait in the waiting room for the duration of the patient's stay.  Patients are required to wear a mask when they enter the hospital.     COVID-19 Pre-Screening Questions:  . In the past 7 to 10 days have you had a cough,  shortness of breath, headache, congestion, fever (100 or greater) body aches, chills, sore throat, or sudden loss of taste or sense of smell? Shortness of breath . Have you been around anyone with known Covid 19? no . Have you been around anyone who is awaiting Covid 19 test results in the past 7 to 10 days? no . Have you been around anyone who has been exposed to Covid 19, or has mentioned symptoms of Covid 19 within the past 7 to 10 days? no   I reviewed procedure/mask/visitor instructions,Covid-19 screening questions with patient, he verbalized understanding, thanked me for call.

## 2019-05-07 ENCOUNTER — Encounter (HOSPITAL_COMMUNITY): Payer: Self-pay | Admitting: Cardiology

## 2019-05-07 ENCOUNTER — Encounter (HOSPITAL_COMMUNITY)
Admission: RE | Disposition: A | Discharge status: Home / Self Care | Payer: Medicare Other | Source: Home / Self Care | Attending: Cardiology

## 2019-05-07 ENCOUNTER — Other Ambulatory Visit: Payer: Self-pay

## 2019-05-07 ENCOUNTER — Ambulatory Visit (HOSPITAL_COMMUNITY)
Admission: RE | Admit: 2019-05-07 | Discharge: 2019-05-07 | Disposition: A | Discharge status: Home / Self Care | Payer: Medicare Other | Attending: Cardiology | Admitting: Cardiology

## 2019-05-07 DIAGNOSIS — R0609 Other forms of dyspnea: Secondary | ICD-10-CM | POA: Diagnosis not present

## 2019-05-07 DIAGNOSIS — Z79899 Other long term (current) drug therapy: Secondary | ICD-10-CM | POA: Diagnosis not present

## 2019-05-07 DIAGNOSIS — R0602 Shortness of breath: Secondary | ICD-10-CM | POA: Diagnosis not present

## 2019-05-07 DIAGNOSIS — I25119 Atherosclerotic heart disease of native coronary artery with unspecified angina pectoris: Secondary | ICD-10-CM | POA: Diagnosis present

## 2019-05-07 DIAGNOSIS — K219 Gastro-esophageal reflux disease without esophagitis: Secondary | ICD-10-CM | POA: Diagnosis not present

## 2019-05-07 DIAGNOSIS — I2089 Other forms of angina pectoris: Secondary | ICD-10-CM | POA: Diagnosis present

## 2019-05-07 DIAGNOSIS — Z881 Allergy status to other antibiotic agents status: Secondary | ICD-10-CM | POA: Insufficient documentation

## 2019-05-07 DIAGNOSIS — I25118 Atherosclerotic heart disease of native coronary artery with other forms of angina pectoris: Secondary | ICD-10-CM | POA: Diagnosis not present

## 2019-05-07 DIAGNOSIS — Z87891 Personal history of nicotine dependence: Secondary | ICD-10-CM | POA: Insufficient documentation

## 2019-05-07 DIAGNOSIS — Z955 Presence of coronary angioplasty implant and graft: Secondary | ICD-10-CM

## 2019-05-07 DIAGNOSIS — M1990 Unspecified osteoarthritis, unspecified site: Secondary | ICD-10-CM | POA: Insufficient documentation

## 2019-05-07 DIAGNOSIS — R9439 Abnormal result of other cardiovascular function study: Secondary | ICD-10-CM | POA: Diagnosis present

## 2019-05-07 DIAGNOSIS — Z7982 Long term (current) use of aspirin: Secondary | ICD-10-CM | POA: Diagnosis not present

## 2019-05-07 DIAGNOSIS — I10 Essential (primary) hypertension: Secondary | ICD-10-CM | POA: Insufficient documentation

## 2019-05-07 DIAGNOSIS — E782 Mixed hyperlipidemia: Secondary | ICD-10-CM | POA: Insufficient documentation

## 2019-05-07 DIAGNOSIS — I208 Other forms of angina pectoris: Secondary | ICD-10-CM | POA: Diagnosis present

## 2019-05-07 HISTORY — PX: CORONARY STENT INTERVENTION: CATH118234

## 2019-05-07 HISTORY — DX: Presence of coronary angioplasty implant and graft: Z95.5

## 2019-05-07 HISTORY — PX: LEFT HEART CATH AND CORONARY ANGIOGRAPHY: CATH118249

## 2019-05-07 LAB — POCT ACTIVATED CLOTTING TIME
Activated Clotting Time: 268 seconds
Activated Clotting Time: 257 seconds

## 2019-05-07 SURGERY — LEFT HEART CATH AND CORONARY ANGIOGRAPHY
Anesthesia: LOCAL

## 2019-05-07 MED ORDER — CLOPIDOGREL BISULFATE 75 MG PO TABS: 75.0000 mg | ORAL_TABLET | Freq: Every day | ORAL | 11 refills | Status: DC

## 2019-05-07 MED ORDER — HYDRALAZINE HCL 20 MG/ML IJ SOLN: 10.0000 mg | INTRAMUSCULAR | Status: DC | PRN

## 2019-05-07 MED ORDER — MORPHINE SULFATE (PF) 2 MG/ML IV SOLN: 2.0000 mg | INTRAVENOUS | Status: DC | PRN

## 2019-05-07 MED ORDER — LABETALOL HCL 5 MG/ML IV SOLN: 10.0000 mg | INTRAVENOUS | Status: DC | PRN

## 2019-05-07 MED ORDER — ACETAMINOPHEN 325 MG PO TABS: 650.0000 mg | ORAL_TABLET | ORAL | Status: DC | PRN

## 2019-05-07 MED ORDER — SODIUM CHLORIDE 0.9% FLUSH: 3.0000 mL | Freq: Two times a day (BID) | INTRAVENOUS | Status: DC

## 2019-05-07 MED ORDER — PANTOPRAZOLE SODIUM 40 MG PO TBEC: 40.0000 mg | DELAYED_RELEASE_TABLET | Freq: Every day | ORAL | Status: DC

## 2019-05-07 MED ORDER — SODIUM CHLORIDE 0.9 % IV SOLN: 250.0000 mL | INTRAVENOUS | Status: DC | PRN

## 2019-05-07 MED ORDER — LIDOCAINE HCL (PF) 1 % IJ SOLN
INTRAMUSCULAR | Status: DC | PRN
  Administered 2019-05-07: 2 mL

## 2019-05-07 MED ORDER — ASPIRIN 81 MG PO CHEW: 81.0000 mg | CHEWABLE_TABLET | ORAL | Status: DC

## 2019-05-07 MED ORDER — FENTANYL CITRATE (PF) 100 MCG/2ML IJ SOLN
INTRAMUSCULAR | Status: DC | PRN
  Administered 2019-05-07: 50 ug via INTRAVENOUS

## 2019-05-07 MED ORDER — MIDAZOLAM HCL 2 MG/2ML IJ SOLN
INTRAMUSCULAR | Status: AC
  Filled 2019-05-07: qty 2

## 2019-05-07 MED ORDER — SODIUM CHLORIDE 0.9 % IV SOLN: INTRAVENOUS | Status: AC

## 2019-05-07 MED ORDER — HEPARIN (PORCINE) IN NACL 1000-0.9 UT/500ML-% IV SOLN
INTRAVENOUS | Status: DC | PRN
  Administered 2019-05-07 (×2): 500 mL

## 2019-05-07 MED ORDER — CLOPIDOGREL BISULFATE 75 MG PO TABS: 75.0000 mg | ORAL_TABLET | Freq: Every day | ORAL | Status: DC

## 2019-05-07 MED ORDER — HEPARIN SODIUM (PORCINE) 1000 UNIT/ML IJ SOLN
INTRAMUSCULAR | Status: AC
  Filled 2019-05-07: qty 1

## 2019-05-07 MED ORDER — SODIUM CHLORIDE 0.9% FLUSH: 3.0000 mL | INTRAVENOUS | Status: DC | PRN

## 2019-05-07 MED ORDER — NITROGLYCERIN 0.4 MG SL SUBL: 0.4000 mg | SUBLINGUAL_TABLET | SUBLINGUAL | 2 refills | Status: DC | PRN

## 2019-05-07 MED ORDER — MIDAZOLAM HCL 2 MG/2ML IJ SOLN
INTRAMUSCULAR | Status: DC | PRN
  Administered 2019-05-07: 2 mg via INTRAVENOUS

## 2019-05-07 MED ORDER — SODIUM CHLORIDE 0.9 % WEIGHT BASED INFUSION
3.0000 mL/kg/h | INTRAVENOUS | Status: AC
  Administered 2019-05-07: 3 mL/kg/h via INTRAVENOUS

## 2019-05-07 MED ORDER — IOHEXOL 350 MG/ML SOLN
INTRAVENOUS | Status: DC | PRN
  Administered 2019-05-07: 130 mL

## 2019-05-07 MED ORDER — FENTANYL CITRATE (PF) 100 MCG/2ML IJ SOLN
INTRAMUSCULAR | Status: AC
  Filled 2019-05-07: qty 2

## 2019-05-07 MED ORDER — ONDANSETRON HCL 4 MG/2ML IJ SOLN: 4.0000 mg | Freq: Four times a day (QID) | INTRAMUSCULAR | Status: DC | PRN

## 2019-05-07 MED ORDER — LIDOCAINE HCL (PF) 1 % IJ SOLN
INTRAMUSCULAR | Status: AC
  Filled 2019-05-07: qty 30

## 2019-05-07 MED ORDER — SODIUM CHLORIDE 0.9 % WEIGHT BASED INFUSION: 1.0000 mL/kg/h | INTRAVENOUS | Status: DC

## 2019-05-07 MED ORDER — CLOPIDOGREL BISULFATE 300 MG PO TABS
ORAL_TABLET | ORAL | Status: DC | PRN
  Administered 2019-05-07: 600 mg via ORAL

## 2019-05-07 MED ORDER — CLOPIDOGREL BISULFATE 300 MG PO TABS
ORAL_TABLET | ORAL | Status: AC
  Filled 2019-05-07: qty 2

## 2019-05-07 MED ORDER — HEPARIN SODIUM (PORCINE) 1000 UNIT/ML IJ SOLN
INTRAMUSCULAR | Status: DC | PRN
  Administered 2019-05-07: 10000 [IU] via INTRAVENOUS
  Administered 2019-05-07: 2000 [IU] via INTRAVENOUS

## 2019-05-07 MED ORDER — LISINOPRIL 40 MG PO TABS: 40.0000 mg | ORAL_TABLET | Freq: Every day | ORAL | Status: DC

## 2019-05-07 MED ORDER — PANTOPRAZOLE SODIUM 40 MG PO TBEC: 40.0000 mg | DELAYED_RELEASE_TABLET | Freq: Every day | ORAL | 12 refills | Status: DC

## 2019-05-07 MED ORDER — HEPARIN (PORCINE) IN NACL 1000-0.9 UT/500ML-% IV SOLN
INTRAVENOUS | Status: AC
  Filled 2019-05-07: qty 1000

## 2019-05-07 MED ORDER — VERAPAMIL HCL 2.5 MG/ML IV SOLN
INTRAVENOUS | Status: DC | PRN
  Administered 2019-05-07: 10 mL via INTRA_ARTERIAL

## 2019-05-07 MED FILL — CLOPIDOGREL 75 MG TABLET: 75 | 30 days supply | Qty: 30 | Fill #0

## 2019-05-07 MED FILL — PANTOPRAZOLE SOD DR 40 MG T: 40 | 30 days supply | Qty: 30 | Fill #0

## 2019-05-07 SURGICAL SUPPLY — 19 items
BALLN EMERGE MR 2.5X20 (BALLOONS) ×2
BALLN SAPPHIRE ~~LOC~~ 3.0X15 (BALLOONS) ×2 IMPLANT
BALLOON EMERGE MR 2.5X20 (BALLOONS) ×1 IMPLANT
CATH OPTITORQUE TIG 4.0 5F (CATHETERS) ×2 IMPLANT
CATH VISTA GUIDE 6FR XBLAD3.5 (CATHETERS) ×2 IMPLANT
DEVICE RAD COMP TR BAND LRG (VASCULAR PRODUCTS) ×2 IMPLANT
GLIDESHEATH SLEND SS 6F .021 (SHEATH) ×2 IMPLANT
GUIDEWIRE INQWIRE 1.5J.035X260 (WIRE) ×1 IMPLANT
INQWIRE 1.5J .035X260CM (WIRE) ×2
KIT ENCORE 26 ADVANTAGE (KITS) ×2 IMPLANT
KIT HEART LEFT (KITS) ×2 IMPLANT
PACK CARDIAC CATHETERIZATION (CUSTOM PROCEDURE TRAY) ×2 IMPLANT
SHEATH PROBE COVER 6X72 (BAG) ×2 IMPLANT
STENT RESOLUTE ONYX 2.75X26 (Permanent Stent) ×2 IMPLANT
SYR MEDRAD MARK 7 150ML (SYRINGE) ×2 IMPLANT
TRANSDUCER W/STOPCOCK (MISCELLANEOUS) ×2 IMPLANT
TUBING CIL FLEX 10 FLL-RA (TUBING) ×2 IMPLANT
WIRE ASAHI PROWATER 180CM (WIRE) ×2 IMPLANT
WIRE RUNTHROUGH .014X180CM (WIRE) ×2 IMPLANT

## 2019-05-07 NOTE — Discharge Instructions (Signed)
Radial Site Care ° °This sheet gives you information about how to care for yourself after your procedure. Your health care provider may also give you more specific instructions. If you have problems or questions, contact your health care provider. °What can I expect after the procedure? °After the procedure, it is common to have: °· Bruising and tenderness at the catheter insertion area. °Follow these instructions at home: °Medicines °· Take over-the-counter and prescription medicines only as told by your health care provider. °Insertion site care °· Follow instructions from your health care provider about how to take care of your insertion site. Make sure you: °? Wash your hands with soap and water before you change your bandage (dressing). If soap and water are not available, use hand sanitizer. °? Change your dressing as told by your health care provider. °? Leave stitches (sutures), skin glue, or adhesive strips in place. These skin closures may need to stay in place for 2 weeks or longer. If adhesive strip edges start to loosen and curl up, you may trim the loose edges. Do not remove adhesive strips completely unless your health care provider tells you to do that. °· Check your insertion site every day for signs of infection. Check for: °? Redness, swelling, or pain. °? Fluid or blood. °? Pus or a bad smell. °? Warmth. °· Do not take baths, swim, or use a hot tub until your health care provider approves. °· You may shower 24-48 hours after the procedure, or as directed by your health care provider. °? Remove the dressing and gently wash the site with plain soap and water. °? Pat the area dry with a clean towel. °? Do not rub the site. That could cause bleeding. °· Do not apply powder or lotion to the site. °Activity ° °· For 24 hours after the procedure, or as directed by your health care provider: °? Do not flex or bend the affected arm. °? Do not push or pull heavy objects with the affected arm. °? Do not  drive yourself home from the hospital or clinic. You may drive 24 hours after the procedure unless your health care provider tells you not to. °? Do not operate machinery or power tools. °· Do not lift anything that is heavier than 10 lb (4.5 kg), or the limit that you are told, until your health care provider says that it is safe. °· Ask your health care provider when it is okay to: °? Return to work or school. °? Resume usual physical activities or sports. °? Resume sexual activity. °General instructions °· If the catheter site starts to bleed, raise your arm and put firm pressure on the site. If the bleeding does not stop, get help right away. This is a medical emergency. °· If you went home on the same day as your procedure, a responsible adult should be with you for the first 24 hours after you arrive home. °· Keep all follow-up visits as told by your health care provider. This is important. °Contact a health care provider if: °· You have a fever. °· You have redness, swelling, or yellow drainage around your insertion site. °Get help right away if: °· You have unusual pain at the radial site. °· The catheter insertion area swells very fast. °· The insertion area is bleeding, and the bleeding does not stop when you hold steady pressure on the area. °· Your arm or hand becomes pale, cool, tingly, or numb. °These symptoms may represent a serious problem   that is an emergency. Do not wait to see if the symptoms will go away. Get medical help right away. Call your local emergency services (911 in the [place] Do not drive yourself to the hospital. °Summary °· After the procedure, it is common to have bruising and tenderness at the site. °· Follow instructions from your health care provider about how to take care of your radial site wound. Check the wound every day for signs of infection. °· Do not lift anything that is heavier than 10 lb (4.5 kg), or the limit that you are told, until your health care provider says  that it is safe. °This information is not intended to replace advice given to you by your health care provider. Make sure you discuss any questions you have with your health care provider. °Document Released: 09/03/2010 Document Revised: 09/06/2017 Document Reviewed: 09/06/2017 °Elsevier Patient Education © 2020 [employer] ° °

## 2019-05-07 NOTE — Interval H&P Note (Signed)
History and Physical Interval Note:  05/07/2019 7:26 AM  Roberto George  has presented today for surgery, with the diagnosis of Dyspnea on Exertion (as Angina Equivalent) with Abnormal Nuclear Stress Test.  The various methods of treatment have been discussed with the patient and family. After consideration of risks, benefits and other options for treatment, the patient has consented to  Procedure(s): LEFT HEART CATH AND CORONARY ANGIOGRAPHY (N/A)  PERCUTANEOUS CORONARY INTERVENTION   as a surgical intervention.  The patient's history has been reviewed, patient examined, no change in status, stable for surgery.  I have reviewed the patient's chart and labs.  Questions were answered to the patient's satisfaction.    Cath Lab Visit (complete for each Cath Lab visit)  Clinical Evaluation Leading to the Procedure:   ACS: No.  Non-ACS:    Anginal Classification: CCS III  Anti-ischemic medical therapy: No Therapy  Non-Invasive Test Results: Intermediate-risk stress test findings: cardiac mortality 1-3%/year  Prior CABG: No previous CABG   Glenetta Hew

## 2019-05-07 NOTE — Discharge Summary (Signed)
Discharge Summary/SAME DAY PCI    Patient ID: Roberto George,  MRN: [id number], DOB/AGE: [birth year] 70 y.o.  Admit date: 05/07/2019 Discharge date: 05/07/2019  Primary Care Provider: Donald Prose Primary Cardiologist: Dr. Audie Box  Discharge Diagnoses    Principal Problem:   Abnormal nuclear stress test Active Problems:   Dyspnea on exertion   Atypical angina [facility])   Coronary artery disease involving native coronary artery of native heart with angina pectoris ([place])   Presence of drug coated stent in LAD coronary artery   Allergies Allergies  Allergen Reactions  . Erythromycin     Mouth broke out with blisters   . Niacin And Related     Flush     Diagnostic Studies/Procedures    Cath: 05/07/19   There is mild left ventricular systolic dysfunction. The left ventricular ejection fraction is 50-55% by visual estimate.  LV end diastolic pressure is normal.  --------  CULPRIT LESIONS: Prox LAD to Mid LAD tandem 70% stenoses with 30% stenosed side branch in 1st Diag.  A drug-eluting stent was successfully placed using a STENT RESOLUTE ONYX [id number]. - post-dilated to 3.1 mm  Post intervention, there is a 0% residual stenosis.  Post intervention, the side branch was reduced to 50% residual stenosis. - mild plaque shift (maintained TIMI 3 flow)  ---  Ost LAD to Prox LAD lesion is 30% stenosed.  Otherwise only minimal CAD   SUMMARY  Single-vessel disease involving very proximal LAD with tandem 70% lesions before and after takeoff of 1st Diag with mild involvement of 1st Diag  Successful DES PCI covering both lesions in the LAD: Resolute Onyx DES 2.7 mm x 26 mm (3.1 mm)  Normal LVEF and EDP.   RECOMMENDATIONS  Return to short stay for TR band removal and bed rest.  Anticipate same-day discharge  Continue aggressive risk medication -> lipid, glycemic and blood pressure control  Consider adding beta-blocker in the outpatient setting  Follow-up with Dr.  Francee Nodal, MD  Diagnostic Dominance: Right  Intervention    _____________   History of Present Illness     Roberto George is a 68 y.o. male with a hx of hypertension, hyperlipidemia who was seen in the office for evaluation of shortness of breath at the request of Donald Prose, MD. Presented for the evaluation of 6 months of shortness of breath.  He reported he developed shortness of breath nearly every week, and was associated with rather strenuous activity.  He reported such as working around the yard in her house.  He was able to do all activities of daily living without any issues.  He seemed to have quite a number of steps recorded on a step counter.  He reported infrequent episodes of atypical chest pain. The pain lasted for about 10 minutes and resolves.  He would have this pain about once a week.  The shortness of breath and chest pain appear to be unrelated.  He reported no exertional chest pain.  He reported some lower extremity swelling after being on his feet all day.  However, he reported no symptoms orthopnea or PND. Most recent lab work shows LDL of 108 creat 1.01.  He is not diabetic.  He smoked for about 2 years and quit when he was 68 years of age.  His mother died of a heart attack at 75.  His father had strokes.  He works as a Engineering geologist, and has been inactive for the past 6 months.  Given his symptoms, he was set up for outpatient cardiac cath.   Hospital Course     Underwent cardiac cath noted above with with PCI/DES x1 to the pLAD. Plan for DAPT with ASA/plavix for at least 6 months. Seen by cardiac rehab while in short stay. No complications post cath. Radial site stable. Instructions/precautions regarding cath site care given prior to discharge. Continued on home medications without change. No BB added 2/2 to bradycardia.   Emi Belfast was seen by Dr. Ellyn Hack and determined stable for discharge home. Follow up in the office has been arranged.  Medications are listed below.   _____________  Discharge Vitals Blood pressure (!) 151/89, pulse (!) 56, temperature 97.9 F (36.6 C), temperature source Oral, resp. rate 18, height 5\' 11"  (1.803 m), weight 87.1 kg, SpO2 97 %.  Filed Weights   05/07/19 0537  Weight: 87.1 kg    Labs & Radiologic Studies    CBC No results for input(s): WBC, NEUTROABS, HGB, HCT, MCV, PLT in the last 72 hours. Basic Metabolic Panel No results for input(s): NA, K, CL, CO2, GLUCOSE, BUN, CREATININE, CALCIUM, MG, PHOS in the last 72 hours. Liver Function Tests No results for input(s): AST, ALT, ALKPHOS, BILITOT, PROT, ALBUMIN in the last 72 hours. No results for input(s): LIPASE, AMYLASE in the last 72 hours. Cardiac Enzymes No results for input(s): CKTOTAL, CKMB, CKMBINDEX, TROPONINI in the last 72 hours. BNP Invalid input(s): POCBNP D-Dimer No results for input(s): DDIMER in the last 72 hours. Hemoglobin A1C No results for input(s): HGBA1C in the last 72 hours. Fasting Lipid Panel No results for input(s): CHOL, HDL, LDLCALC, TRIG, CHOLHDL, LDLDIRECT in the last 72 hours. Thyroid Function Tests No results for input(s): TSH, T4TOTAL, T3FREE, THYROIDAB in the last 72 hours.  Invalid input(s): FREET3 _____________  No results found. Disposition   Pt is being discharged home today in good condition.  Follow-up Plans & Appointments    Follow-up Information    Almyra Deforest, [place] Follow up on 05/17/2019.   Specialties: Cardiology, Radiology Why: at 8:15am for your follow up appt.  Contact information: [address] [place] [address] [phone]          Discharge Instructions    AMB Referral to Cardiac Rehabilitation - Phase II   Complete by: As directed    Diagnosis:  Coronary Stents Stable Angina     After initial evaluation and assessments completed: Virtual Based Care may be provided alone or in conjunction with Phase 2 Cardiac Rehab based on patient barriers.: Yes      Discharge Medications     Medication List    STOP taking these medications   esomeprazole 20 MG capsule Commonly known as: [place] Replaced by: pantoprazole 40 MG tablet     TAKE these medications   aspirin EC 81 MG tablet Take 81 mg by mouth daily.   atorvastatin 40 MG tablet Commonly known as: LIPITOR Take 40 mg by mouth at bedtime.   cholecalciferol 1000 units tablet Commonly known as: VITAMIN D Take 1,000 Units by mouth daily.   clopidogrel 75 MG tablet Commonly known as: Plavix Take 1 tablet (75 mg total) by mouth daily.   cyclobenzaprine 10 MG tablet Commonly known as: FLEXERIL Take 10 mg by mouth at bedtime as needed for muscle spasms.   fluticasone 50 MCG/ACT nasal spray Commonly known as: FLONASE Place 1 spray into both nostrils daily as needed for allergies or rhinitis.   folic acid [id number] MCG tablet  Commonly known as: FOLVITE Take 800 mcg by mouth daily.   lisinopril 40 MG tablet Commonly known as: ZESTRIL Take 40 mg by mouth at bedtime.   nitroGLYCERIN 0.4 MG SL tablet Commonly known as: Nitrostat Place 1 tablet (0.4 mg total) under the tongue every 5 (five) minutes as needed.   pantoprazole 40 MG tablet Commonly known as: PROTONIX Take 1 tablet (40 mg total) by mouth daily. Start taking on: May 08, 2019 Replaces: esomeprazole 20 MG capsule   sertraline 50 MG tablet Commonly known as: ZOLOFT Take 50 mg by mouth at bedtime.        Acute coronary syndrome (MI, NSTEMI, STEMI, etc) this admission?: No.     Outstanding Labs/Studies   N/a   Duration of Discharge Encounter   Greater than 30 minutes including physician time.  Signed, Reino Bellis NP-C 05/07/2019, 1:39 PM

## 2019-05-07 NOTE — Progress Notes (Addendum)
Discussed stent, Plavix, restrictions, diet, exercise and activity, NTG and CRPII. Good reception. Will refer to [facility].   Pt is interested in participating in Virtual Cardiac and Pulmonary Rehab. Pt advised that Virtual Cardiac and Pulmonary Rehab is provided at no cost to the patient. Checklist:  1. Pt has smart device  ie smartphone and/or ipad for downloading an app  Yes 2. Reliable internet/wifi service    Yes 3. Understands how to use their smartphone and navigate within an app.  Yes  Pt verbalized understanding and is in agreement. [place], ACSM 2:25 PM 05/07/2019

## 2019-05-08 ENCOUNTER — Encounter (HOSPITAL_COMMUNITY): Payer: Self-pay | Admitting: Cardiology

## 2019-05-13 ENCOUNTER — Telehealth: Payer: Self-pay | Admitting: Physician Assistant

## 2019-05-13 MED ORDER — AMLODIPINE BESYLATE 10 MG PO TABS: 10.0000 mg | ORAL_TABLET | Freq: Every day | ORAL | 3 refills | Status: DC

## 2019-05-13 NOTE — Telephone Encounter (Signed)
° ° °  Pt c/o BP issue: STAT if pt c/o blurred vision, one-sided weakness or slurred speech  1. What are your last 5 BP readings? 155/88, 154/93, 155/94  2. Are you having any other symptoms (ex. Dizziness, headache, blurred vision, passed out)? headache  3. What is your BP issue? Reporting BP, feels BP too high

## 2019-05-13 NOTE — Telephone Encounter (Addendum)
Spoke with patient of Dr. Audie Box who reports elevated BP  9/22 - heart cath + stent 9/23 - 155/83 9/24 - 159/84 - report chest/arm painfor 5 mins while lying down that resolved on its own 9/25 - 133/82 9/26 - 155/94 9/27 - 153/93 9/28 - 153/88 (am)  Patient checks BP around 1pm Patient takes lisinopril 40mg  around 6-6:30pm and goes to bed around 8pm  Patient reports dull headache for a few days - no other current complaints  Will route to MD to review/advise

## 2019-05-13 NOTE — Telephone Encounter (Signed)
-----   Message from Geralynn Rile, MD sent at 05/13/2019 9:29 AM EDT -----  Can we add norvasc 10 mg daily?

## 2019-05-13 NOTE — Telephone Encounter (Signed)
Left detailed message on name-verified VM with MD recommendations. Rx(s) sent to pharmacy electronically.

## 2019-05-17 ENCOUNTER — Other Ambulatory Visit: Payer: Self-pay

## 2019-05-17 ENCOUNTER — Ambulatory Visit: Payer: Medicare Other | Admitting: Physician Assistant

## 2019-05-17 ENCOUNTER — Encounter: Payer: Self-pay | Admitting: Physician Assistant

## 2019-05-17 VITALS — BP 128/84 | HR 65 | Temp 97.3°F | Ht 71.0 in | Wt 192.0 lb

## 2019-05-17 DIAGNOSIS — I2510 Atherosclerotic heart disease of native coronary artery without angina pectoris: Secondary | ICD-10-CM | POA: Diagnosis not present

## 2019-05-17 DIAGNOSIS — F1010 Alcohol abuse, uncomplicated: Secondary | ICD-10-CM | POA: Diagnosis not present

## 2019-05-17 DIAGNOSIS — I10 Essential (primary) hypertension: Secondary | ICD-10-CM | POA: Diagnosis not present

## 2019-05-17 DIAGNOSIS — E785 Hyperlipidemia, unspecified: Secondary | ICD-10-CM | POA: Diagnosis not present

## 2019-05-17 MED ORDER — ATORVASTATIN CALCIUM 80 MG PO TABS: 80.0000 mg | ORAL_TABLET | Freq: Every day | ORAL | 1 refills | Status: DC

## 2019-05-17 NOTE — Progress Notes (Signed)
Cardiology Office Note    Date:  05/17/2019   ID:  Roberto George, DOB 02-23-1951, MRN [id number]  PCP:  Donald Prose, MD  Cardiologist:  Dr. Audie Box  Chief Complaint  Patient presents with  . Follow-up    post PCI, seen for Dr. Audie Box    History of Present Illness:  Roberto George is a 68 y.o. male with PMH of HTN, HLD and history of rheumatic fever who was recently seen for evaluation of dyspnea on exertion.  Echocardiogram obtained on 04/19/2019 showed EF of 55 to 60%, no significant valve issue.  Myoview obtained on 04/25/2019 showed EF 46%, medium fixed defect of mild severity present in the basal inferoseptal, basal inferior, mid inferoseptal, mid inferior and apical inferior location consistent with prior infarct versus diaphragmatic attenuation.  The stress test was personally reviewed by Dr. Audie Box who felt it was abnormal.  Cardiac catheterization was recommended to further evaluate his symptoms.  He eventually underwent cardiac catheterization on 05/07/2019 which showed normal EF 50 to 55%, 70% proximal to mid LAD lesion, 30% D1, 30% ostial LAD lesion, otherwise minimal disease.  He underwent DES to proximal and mid LAD.  Postprocedure, he had 50% residual in the first diagonal.  He was eventually discharged on aspirin and Plavix.  Beta-blocker was not added due to his baseline bradycardia.  Patient presents today for cardiology office visit.  He states he did have an episode of chest pain about a day after his stent placement, however at the moment he tried to get up and to take nitro some nitroglycerin the chest pain suddenly went away.  The total duration was less than 5 minutes.  He has been able to exercise since then without any further chest discomfort.  He has been compliant with aspirin and Plavix therapy.  I have reviewed his recent lipid panel.  Last lipid panel obtained at outside facility on 03/26/2019 showed a total cholesterol 187, HDL 45, LDL 108, triglyceride 169.  He is  LDL and triglyceride not at goal, therefore I increased his Lipitor to 80 mg daily.  He will need a fasting lipid panel and LFT in 2 months.  He can follow-up with Dr. Audie Box in 3 months.  Otherwise he does not have any heart failure symptoms on physical exam.   Past Medical History:  Diagnosis Date  . Allergic rhinitis   . Arthritis   . Complication of anesthesia    slow to wake up  . Diverticulosis of colon (without mention of hemorrhage) 06/10/2004   Dr Erskine Emery  . GERD (gastroesophageal reflux disease)   . Hemorrhoids   . History of kidney stones   . Hyperlipidemia   . Hypertension   . Multiple lipomas    back and scalp  . Presence of drug coated stent in LAD coronary artery 05/07/2019   Resolut Onyx DES (near ostial prox LAD crossing D1) 2.75 mm x 26 mm - 3.1 mm  . Rheumatic fever     Past Surgical History:  Procedure Laterality Date  . CORONARY STENT INTERVENTION N/A 05/07/2019   Procedure: CORONARY STENT INTERVENTION;  Surgeon: Leonie Man, MD;  Location: [facility];  Service: Cardiovascular;  Laterality: N/A;  . LEFT HEART CATH AND CORONARY ANGIOGRAPHY N/A 05/07/2019   Procedure: LEFT HEART CATH AND CORONARY ANGIOGRAPHY;  Surgeon: Leonie Man, MD;  Location: [facility];  Service: Cardiovascular;  Laterality: N/A;  . MASS EXCISION N/A 04/08/2014   Procedure: EXCISION OF  POSTERIOR SCALP MASS AND EXC OF BACK MASS;  Surgeon: Edward Jolly, MD;  Location: [facility];  Service: General;  Laterality: N/A;  . NASAL SEPTUM SURGERY  >10 yrs ago  . TONSILLECTOMY AND ADENOIDECTOMY      Current Medications: Outpatient Medications Prior to Visit  Medication Sig Dispense Refill  . amLODipine (NORVASC) 10 MG tablet Take 1 tablet (10 mg total) by mouth daily. 90 tablet 3  . aspirin EC 81 MG tablet Take 81 mg by mouth daily.    . cholecalciferol (VITAMIN D) 1000 UNITS tablet Take 1,000 Units by mouth daily.     . clopidogrel (PLAVIX) 75 MG tablet Take 1  tablet (75 mg total) by mouth daily. 30 tablet 11  . cyclobenzaprine (FLEXERIL) 10 MG tablet Take 10 mg by mouth at bedtime as needed for muscle spasms.     . fluticasone (FLONASE) 50 MCG/ACT nasal spray Place 1 spray into both nostrils daily as needed for allergies or rhinitis.    . folic acid (FOLVITE) Q000111Q MCG tablet Take 800 mcg by mouth daily.     Marland Kitchen lisinopril (PRINIVIL,ZESTRIL) 40 MG tablet Take 40 mg by mouth at bedtime.     . nitroGLYCERIN (NITROSTAT) 0.4 MG SL tablet Place 1 tablet (0.4 mg total) under the tongue every 5 (five) minutes as needed. 25 tablet 2  . pantoprazole (PROTONIX) 40 MG tablet Take 1 tablet (40 mg total) by mouth daily. 30 tablet 12  . sertraline (ZOLOFT) 50 MG tablet Take 50 mg by mouth at bedtime.     Marland Kitchen atorvastatin (LIPITOR) 40 MG tablet Take 40 mg by mouth at bedtime.      No facility-administered medications prior to visit.      Allergies:   Erythromycin and Niacin and related   Social History   Socioeconomic History  . Marital status: Married    Spouse name: Not on file  . Number of children: 2  . Years of education: Not on file  . Highest education level: Not on file  Occupational History  . Occupation: retired  Scientific laboratory technician  . Financial resource strain: Not on file  . Food insecurity    Worry: Not on file    Inability: Not on file  . Transportation needs    Medical: Not on file    Non-medical: Not on file  Tobacco Use  . Smoking status: Former Smoker    Quit date: 08/15/1970    Years since quitting: 48.7  . Smokeless tobacco: Never Used  Substance and Sexual Activity  . Alcohol use: Yes    Comment: 12 beers on the weekend  . Drug use: No  . Sexual activity: Not on file  Lifestyle  . Physical activity    Days per week: Not on file    Minutes per session: Not on file  . Stress: Not on file  Relationships  . Social Herbalist on phone: Not on file    Gets together: Not on file    Attends religious service: Not on file     Active member of club or organization: Not on file    Attends meetings of clubs or organizations: Not on file    Relationship status: Not on file  Other Topics Concern  . Not on file  Social History Narrative  . Not on file     Family History:  The patient's family history includes Heart attack in his maternal grandmother and mother; Stroke in his father.  ROS:   Please see the history of present illness.    ROS All other systems reviewed and are negative.   PHYSICAL EXAM:   VS:  BP 128/84   Pulse 65   Temp (!) 97.3 F (36.3 C) (Temporal)   Ht 5\' 11"  (1.803 m)   Wt 192 lb (87.1 kg)   SpO2 95%   BMI 26.78 kg/m    GEN: Well nourished, well developed, in no acute distress  HEENT: normal  Neck: no JVD, carotid bruits, or masses Cardiac: RRR; no murmurs, rubs, or gallops,no edema  Respiratory:  clear to auscultation bilaterally, normal work of breathing GI: soft, nontender, nondistended, + BS MS: no deformity or atrophy  Skin: warm and dry, no rash Neuro:  Alert and Oriented x 3, Strength and sensation are intact Psych: euthymic mood, full affect  Wt Readings from Last 3 Encounters:  05/17/19 192 lb (87.1 kg)  05/07/19 192 lb (87.1 kg)  04/25/19 193 lb (87.5 kg)      Studies/Labs Reviewed:   EKG:  EKG is ordered today.  The ekg ordered today demonstrates normal sinus rhythm, left bundle branch block.  Recent Labs: 05/03/2019: BUN 13; Creatinine, Ser 1.17; Hemoglobin 13.4; Platelets 165; Potassium 4.8; Sodium 141   Lipid Panel No results found for: CHOL, TRIG, HDL, CHOLHDL, VLDL, LDLCALC, LDLDIRECT  Additional studies/ records that were reviewed today include:   Echo 04/19/2019 IMPRESSIONS    1. The left ventricle has normal systolic function, with an ejection fraction of 55-60%. The cavity size was normal. Left ventricular diastolic parameters were normal. No evidence of left ventricular regional wall motion abnormalities.  2. The right ventricle has normal  systolic function. The cavity was normal. There is no increase in right ventricular wall thickness.  3. The aorta is normal unless otherwise noted.   Cath 05/07/2019  There is mild left ventricular systolic dysfunction. The left ventricular ejection fraction is 50-55% by visual estimate.  LV end diastolic pressure is normal.  --------  CULPRIT LESIONS: Prox LAD to Mid LAD tandem 70% stenoses with 30% stenosed side branch in 1st Diag.  A drug-eluting stent was successfully placed using a STENT RESOLUTE ONYX 2.75X26. - post-dilated to 3.1 mm  Post intervention, there is a 0% residual stenosis.  Post intervention, the side branch was reduced to 50% residual stenosis. - mild plaque shift (maintained TIMI 3 flow)  ---  Ost LAD to Prox LAD lesion is 30% stenosed.  Otherwise only minimal CAD   SUMMARY  Single-vessel disease involving very proximal LAD with tandem 70% lesions before and after takeoff of 1st Diag with mild involvement of 1st Diag  Successful DES PCI covering both lesions in the LAD: Resolute Onyx DES 2.7 mm x 26 mm (3.1 mm)  Normal LVEF and EDP.   RECOMMENDATIONS  Return to short stay for TR band removal and bed rest.  Anticipate same-day discharge  Continue aggressive risk medication -> lipid, glycemic and blood pressure control  Consider adding beta-blocker in the outpatient setting  Follow-up with Dr. Audie Box  ASSESSMENT:    1. Coronary artery disease involving native coronary artery of native heart without angina pectoris   2. Essential hypertension   3. Hyperlipidemia LDL goal <70   4. Alcohol abuse      PLAN:  In order of problems listed above:  1. CAD: Continue aspirin and Plavix.  He noticed his dyspnea on exertion has been improving since stent placement.  2. Hypertension: Blood pressure very well controlled on  lisinopril and amlodipine.  Not on beta-blocker given baseline bradycardia  3. Hyperlipidemia: LDL and triglycerides not at goal  on recent lab work.  Increase Lipitor to 80 mg daily, obtain fasting lipid panel and LFT in 2 months.  4. Alcohol abuse: He used to drink up to 20 beers on the weekend, he has significantly limited alcohol consumption.  I recommended 1-2 beers per week.  He seems to be quite willing to cut back on alcohol and it does admit he used to drink too much.    Medication Adjustments/Labs and Tests Ordered: Current medicines are reviewed at length with the patient today.  Concerns regarding medicines are outlined above.  Medication changes, Labs and Tests ordered today are listed in the Patient Instructions below. Patient Instructions  Medication Instructions:  INCREASE Lipitor to 80mg  Take 1 tablet once a day  If you need a refill on your cardiac medications before your next appointment, please call your pharmacy.   Lab work: Your physician recommends that you return for lab work in: 2 months(December) fasting LIPID, LFT If you have labs (blood work) drawn today and your tests are completely normal, you will receive your results only by: Marland Kitchen MyChart Message (if you have MyChart) OR . A paper copy in the mail If you have any lab test that is abnormal or we need to change your treatment, we will call you to review the results.  Testing/Procedures: None   Follow-Up: At [facility], you and your health needs are our priority.  As part of our continuing mission to provide you with exceptional heart care, we have created designated Provider Care Teams.  These Care Teams include your primary Cardiologist (physician) and Advanced Practice Providers (APPs -  Physician Assistants and Nurse Practitioners) who all work together to provide you with the care you need, when you need it. You will need a follow up appointment in 3 months.  You may see Evalina Field, MD or one of the following Advanced Practice Providers on your designated Care Team:   Kerin Ransom, PA-C Roby Lofts, [place] . Sande Rives,  PA-C  Any Other Special Instructions Will Be Listed Below (If Applicable).      Hilbert Corrigan, [place]  05/17/2019 8:50 AM    [address], [place], [place]  [address] Phone: [phone]; Fax: [phone]

## 2019-05-17 NOTE — Patient Instructions (Signed)
Medication Instructions:  INCREASE Lipitor to 80mg  Take 1 tablet once a day  If you need a refill on your cardiac medications before your next appointment, please call your pharmacy.   Lab work: Your physician recommends that you return for lab work in: 2 months(December) fasting LIPID, LFT If you have labs (blood work) drawn today and your tests are completely normal, you will receive your results only by: Marland Kitchen MyChart Message (if you have MyChart) OR . A paper copy in the mail If you have any lab test that is abnormal or we need to change your treatment, we will call you to review the results.  Testing/Procedures: None   Follow-Up: At [facility], you and your health needs are our priority.  As part of our continuing mission to provide you with exceptional heart care, we have created designated Provider Care Teams.  These Care Teams include your primary Cardiologist (physician) and Advanced Practice Providers (APPs -  Physician Assistants and Nurse Practitioners) who all work together to provide you with the care you need, when you need it. You will need a follow up appointment in 3 months.  You may see Evalina Field, MD or one of the following Advanced Practice Providers on your designated Care Team:   Kerin Ransom, PA-C Roby Lofts, [place] . Sande Rives, PA-C  Any Other Special Instructions Will Be Listed Below (If Applicable).

## 2019-05-22 ENCOUNTER — Telehealth: Payer: Self-pay | Admitting: Cardiovascular Disease

## 2019-05-22 NOTE — Telephone Encounter (Signed)
° ° °  Patient calling to report constipation. Wants to know what medication to take

## 2019-05-22 NOTE — Telephone Encounter (Signed)
Eft message for patient, Roberto George, stool softeners, hot prune juice or suppositories recommended. He will call back with questions.

## 2019-05-24 ENCOUNTER — Telehealth (HOSPITAL_COMMUNITY): Payer: Self-pay

## 2019-05-24 ENCOUNTER — Encounter (HOSPITAL_COMMUNITY): Payer: Self-pay

## 2019-05-24 NOTE — Telephone Encounter (Signed)
Attempted to call patient in regards to Cardiac Rehab - LM on VM Mailed letter 

## 2019-05-29 NOTE — Telephone Encounter (Signed)
Patient called back today stating miralaxx is not working for him, it has been 3 days and he still hasn't had a bowel movement.  He states when he lays down at night he is having lower stomach pain.  He wants to know what else he can try.

## 2019-05-29 NOTE — Telephone Encounter (Signed)
Left detailed message with RN's message below or he can call PCP for further direction

## 2019-06-12 ENCOUNTER — Telehealth (HOSPITAL_COMMUNITY): Payer: Self-pay

## 2019-06-12 NOTE — Telephone Encounter (Signed)
No response from pt regarding CR.  Closed referral.  

## 2019-07-25 LAB — HEPATIC FUNCTION PANEL
ALT: 38 IU/L (ref 0–44)
AST: 27 IU/L (ref 0–40)
Albumin: 4.3 g/dL (ref 3.8–4.8)
Alkaline Phosphatase: 76 IU/L (ref 39–117)
Bilirubin Total: 0.8 mg/dL (ref 0.0–1.2)
Bilirubin, Direct: 0.19 mg/dL (ref 0.00–0.40)
Total Protein: 6.3 g/dL (ref 6.0–8.5)

## 2019-07-25 LAB — LIPID PANEL
Chol/HDL Ratio: 3.7 ratio (ref 0.0–5.0)
Cholesterol, Total: 172 mg/dL (ref 100–199)
HDL: 46 mg/dL (ref >39)
LDL Chol Calc (NIH): 104 mg/dL — ABNORMAL HIGH (ref 0–99)
Triglycerides: 121 mg/dL (ref 0–149)
VLDL Cholesterol Cal: 22 mg/dL (ref 5–40)

## 2019-07-30 ENCOUNTER — Other Ambulatory Visit: Payer: Self-pay

## 2019-07-30 DIAGNOSIS — E785 Hyperlipidemia, unspecified: Secondary | ICD-10-CM

## 2019-09-02 ENCOUNTER — Telehealth: Payer: Self-pay | Admitting: Cardiovascular Disease

## 2019-09-02 NOTE — Telephone Encounter (Signed)
Roberto George:  Can you tell him to reduce his lisinopril to 20 mg daily?  -W

## 2019-09-02 NOTE — Telephone Encounter (Signed)
Patient called with MD advice. Voiced understanding. Med list updated

## 2019-09-02 NOTE — Telephone Encounter (Signed)
Pt c/o BP issue: STAT if pt c/o blurred vision, one-sided weakness or slurred speech  1. What are your last 5 BP readings?   68/40  101/60    2. Are you having any other symptoms (ex. Dizziness, headache, blurred vision, passed out)? No  3. What is your BP issue? BP is low  Patient also states his arms are going numb at night. Please call.

## 2019-09-02 NOTE — Telephone Encounter (Signed)
Spoke with patient of Dr Audie Box who reports low BP on Saturday. He was working with a saw/chainsaw this day. He was a little winded and decided to check BP. On Saturday, BP was 68/40 and then 1 hour later 101/60. His normal BP range 115-125/65-75. In the last week, he reports his "arms want to go to sleep" when he goes to bed - this only occurs when lying down to go to sleep. Denies lightheadedness, dizziness feeling like he will pass out.   He feels OK today but reports more fatigue than normal BP today 106/65 - after lisinopril dose.   He takes lisinopril in AM and amlodipine in PM  Advised will notify MD for advice/recommendations

## 2019-09-03 MED ORDER — LISINOPRIL 20 MG PO TABS: 20.0000 mg | ORAL_TABLET | Freq: Every day | ORAL | 1 refills | Status: DC

## 2019-09-03 NOTE — Addendum Note (Signed)
Addended by: Patria Mane A on: 09/03/2019 09:41 AM   Modules accepted: Orders

## 2019-09-03 NOTE — Telephone Encounter (Signed)
Returned call to patient.  He states he is unable to break the lisinopril 40mg  tablet in half.   Requesting a 20 mg tablet be sent to his pharmacy.  New rx sent to preferred pharmacy.  Patient aware.

## 2019-09-03 NOTE — Telephone Encounter (Signed)
Patient is calling to follow up, stating that he is unable to break lisinopril tablet in half without dismantling it. Patient states he would like to contact pharmacy change prescription to 20 mg. Please call.

## 2019-09-09 ENCOUNTER — Ambulatory Visit: Payer: Medicare Other | Attending: Internal Medicine

## 2019-09-09 DIAGNOSIS — Z23 Encounter for immunization: Secondary | ICD-10-CM | POA: Insufficient documentation

## 2019-09-09 NOTE — Progress Notes (Signed)
   Covid-19 Vaccination Clinic  Name:  Roberto George    MRN: [id number] DOB: Nov 19, 1950  09/09/2019  Mr. [name] was observed post Covid-19 immunization for 30 minutes based on pre-vaccination screening without incidence. He was provided with Vaccine Information Sheet and instruction to access the V-Safe system.   Mr. [name] was instructed to call 911 with any severe reactions post vaccine: Marland Kitchen Difficulty breathing  . Swelling of your face and throat  . A fast heartbeat  . A bad rash all over your body  . Dizziness and weakness    Immunizations Administered    Name Date Dose VIS Date Route   Pfizer COVID-19 Vaccine 09/09/2019  2:40 PM 0.3 mL 07/26/2019 Intramuscular   Manufacturer: [place]   Lot: [id number]   Ida: [id number]

## 2019-09-23 ENCOUNTER — Ambulatory Visit: Payer: Medicare Other | Admitting: Internal Medicine

## 2019-09-23 ENCOUNTER — Encounter: Payer: Self-pay | Admitting: Internal Medicine

## 2019-09-23 ENCOUNTER — Telehealth: Payer: Self-pay

## 2019-09-23 ENCOUNTER — Other Ambulatory Visit: Payer: Self-pay

## 2019-09-23 VITALS — BP 115/74 | HR 69 | Ht 71.0 in | Wt 195.0 lb

## 2019-09-23 DIAGNOSIS — I2510 Atherosclerotic heart disease of native coronary artery without angina pectoris: Secondary | ICD-10-CM | POA: Diagnosis not present

## 2019-09-23 DIAGNOSIS — E785 Hyperlipidemia, unspecified: Secondary | ICD-10-CM | POA: Diagnosis not present

## 2019-09-23 MED ORDER — EZETIMIBE 10 MG PO TABS: 10.0000 mg | ORAL_TABLET | Freq: Every day | ORAL | 3 refills | Status: DC

## 2019-09-23 NOTE — Patient Instructions (Signed)
Medication Instructions:  Start taking 10mg  Zetia daily  If you need a refill on your cardiac medications before your next appointment, please call your pharmacy.   Lab work: 4 months get repeat Lipids If you have labs (blood work) drawn today and your tests are completely normal, you will receive your results only by: [place] (if you have MyChart) OR A paper copy in the mail If you have any lab test that is abnormal or we need to change your treatment, we will call you to review the results.  Testing/Procedures: NONE  Follow-Up: At [facility], you and your health needs are our priority.  As part of our continuing mission to provide you with exceptional heart care, we have created designated Provider Care Teams.  These Care Teams include your primary Cardiologist (physician) and Advanced Practice Providers (APPs -  Physician Assistants and Nurse Practitioners) who all work together to provide you with the care you need, when you need it. You may see Dr. Debara Pickett or one of the following Advanced Practice Providers on your designated Care Team:    Kerin Ransom, PA-C  [place], [place]  Coletta Memos, [place]  Your physician wants you to follow-up in: 4 months with Dr. Debara Pickett after labs

## 2019-09-23 NOTE — Telephone Encounter (Signed)
Pt called stating that he spoke to his PCP about the issues he is having with his numbness in his arms and chest per Dr. Lysbeth Penner request. He stated that his PCP advised him to call us to get a neurology referral. Dr. Debara Pickett stated that he advised the pt to talk to PCP about it. Called pt back and advised him if his PCP wants the pt to see a neurologist, then his PCP needs to put in the referral. Pt verbalized understanding.

## 2019-09-23 NOTE — Progress Notes (Addendum)
LIPID CLINIC CONSULT NOTE  Chief Complaint:  Manage dyslipidemia  Primary Care Physician: Donald Prose, MD  Primary Cardiologist:  Evalina Field, MD  HPI:  Roberto George is a 69 y.o. male who is being seen today for the evaluation of dyslipidemia at the request of Almyra Deforest, [place].  [name] is a pleasant 69 year old male who recently had progressive shortness of breath and an abnormal Myoview stress test.  He underwent left heart catheterization which showed a 70% proximal to mid LAD lesion, 30% diagonal and 30% ostial LAD lesion.  He had drug-eluting stent to the mid LAD and was sent out on aspirin and Plavix.  Overall he had done well except recently he had an episode where he was using a chainsaw.  He was outside and became severely fatigued and short of breath.  Blood pressure was noted to be 60/40.  Eventually he recovered and was advised to decrease his lisinopril down to 20 mg daily.  Since then blood pressure has been running better.  His blood pressure today was 115/74.  He also saw Almyra Deforest, PA-C, in October who noted his lipids were above goal.  He was advised to increase atorvastatin from 40 to 80 mg.  Mr. Contorno reported he did this for about a week and had significant myalgias and abnormal symptoms and went back to the 40 mg Lipitor dose he had been taking for years.  LDL remains fairly stable at 108.  His goal LDL is less than 70.  He has tried to make dietary changes, reducing red meats, saturated fats, [place] fries and other sources of cholesterol in his diet. He also mentioned some numbness and tingling in both arms at times, which did not sound cardiac to me. I recommended follow-up with his PCP and they could consider possibly neurology referral if it persists.  PMHx:  Past Medical History:  Diagnosis Date  . Allergic rhinitis   . Arthritis   . Complication of anesthesia    slow to wake up  . Diverticulosis of colon (without mention of hemorrhage) 06/10/2004   Dr  Erskine Emery  . GERD (gastroesophageal reflux disease)   . Hemorrhoids   . History of kidney stones   . Hyperlipidemia   . Hypertension   . Multiple lipomas    back and scalp  . Presence of drug coated stent in LAD coronary artery 05/07/2019   Resolut Onyx DES (near ostial prox LAD crossing D1) 2.75 mm x 26 mm - 3.1 mm  . Rheumatic fever     Past Surgical History:  Procedure Laterality Date  . CORONARY STENT INTERVENTION N/A 05/07/2019   Procedure: CORONARY STENT INTERVENTION;  Surgeon: Leonie Man, MD;  Location: [facility];  Service: Cardiovascular;  Laterality: N/A;  . LEFT HEART CATH AND CORONARY ANGIOGRAPHY N/A 05/07/2019   Procedure: LEFT HEART CATH AND CORONARY ANGIOGRAPHY;  Surgeon: Leonie Man, MD;  Location: [facility];  Service: Cardiovascular;  Laterality: N/A;  . MASS EXCISION N/A 04/08/2014   Procedure: EXCISION OF POSTERIOR SCALP MASS AND EXC OF BACK MASS;  Surgeon: Edward Jolly, MD;  Location: [facility];  Service: General;  Laterality: N/A;  . NASAL SEPTUM SURGERY  >10 yrs ago  . TONSILLECTOMY AND ADENOIDECTOMY      FAMHx:  Family History  Problem Relation Age of Onset  . Heart attack Mother   . Heart attack Maternal Grandmother   . Stroke Father  SOCHx:   reports that he quit smoking about 49 years ago. He has never used smokeless tobacco. He reports current alcohol use. He reports that he does not use drugs.  ALLERGIES:  Allergies  Allergen Reactions  . Erythromycin     Mouth broke out with blisters   . Niacin And Related     Flush     ROS: Pertinent items noted in HPI and remainder of comprehensive ROS otherwise negative.  HOME MEDS: Current Outpatient Medications on File Prior to Visit  Medication Sig Dispense Refill  . amLODipine (NORVASC) 10 MG tablet Take 1 tablet (10 mg total) by mouth daily. 90 tablet 3  . aspirin EC 81 MG tablet Take 81 mg by mouth daily.    Marland Kitchen atorvastatin (LIPITOR) 40 MG tablet Take 40 mg by  mouth daily.    . cholecalciferol (VITAMIN D) 1000 UNITS tablet Take 1,000 Units by mouth daily.     . clopidogrel (PLAVIX) 75 MG tablet Take 1 tablet (75 mg total) by mouth daily. 30 tablet 11  . cyclobenzaprine (FLEXERIL) 10 MG tablet Take 10 mg by mouth at bedtime as needed for muscle spasms.     . fluticasone (FLONASE) 50 MCG/ACT nasal spray Place 1 spray into both nostrils daily as needed for allergies or rhinitis.    . folic acid (FOLVITE) Q000111Q MCG tablet Take 800 mcg by mouth daily.     Marland Kitchen lisinopril (ZESTRIL) 20 MG tablet Take 1 tablet (20 mg total) by mouth daily. 90 tablet 1  . nitroGLYCERIN (NITROSTAT) 0.4 MG SL tablet Place 1 tablet (0.4 mg total) under the tongue every 5 (five) minutes as needed. 25 tablet 2  . pantoprazole (PROTONIX) 40 MG tablet Take 1 tablet (40 mg total) by mouth daily. 30 tablet 12  . sertraline (ZOLOFT) 50 MG tablet Take 50 mg by mouth at bedtime.      No current facility-administered medications on file prior to visit.    LABS/IMAGING: No results found for this or any previous visit (from the past 48 hour(s)). No results found.  LIPID PANEL:    Component Value Date/Time   CHOL 172 07/25/2019 0852   TRIG 121 07/25/2019 0852   HDL 46 07/25/2019 0852   CHOLHDL 3.7 07/25/2019 0852   LDLCALC 104 (H) 07/25/2019 0852    WEIGHTS: Wt Readings from Last 3 Encounters:  09/23/19 195 lb (88.5 kg)  05/17/19 192 lb (87.1 kg)  05/07/19 192 lb (87.1 kg)    VITALS: BP 115/74   Pulse 69   Ht 5\' 11"  (1.803 m)   Wt 195 lb (88.5 kg)   SpO2 99%   BMI 27.20 kg/m   EXAM: General appearance: alert and no distress Lungs: clear to auscultation bilaterally Heart: regular rate and rhythm, S1, S2 normal, no murmur, click, rub or gallop Extremities: extremities normal, atraumatic, no cyanosis or edema Neurologic: Grossly normal  EKG: Deferred  ASSESSMENT: 1. Mixed dyslipidemia, goal LDL less than 70 2. Coronary artery disease status post PCI to the LAD  (04/2019)  PLAN: 1.   Mr. Henness has a mixed dyslipidemia and is above target LDL less than 70.  He is on max tolerated atorvastatin 40 mg but had myalgias when increasing the dose to 80 mg.  I will plan adding ezetimibe 10 mg daily to his current regimen.  This should get him to a target LDL less than 70 or very close to it.  Advised further dietary changes and more physical activity.  We will  plan repeat lipids in 3 to 4 months and follow-up at that time.  Thanks again for the kind referral.  Pixie Casino, MD, FACC, [place] Director of the [facility] Lipid [facility] &  Cardiovascular Risk Reduction Clinic Diplomate of the American Board of Clinical Lipidology Attending Cardiologist  Direct Dial: [phone]  Fax: [phone]  Website:  [email or web address] 09/23/2019, 9:23 AM

## 2019-09-26 ENCOUNTER — Other Ambulatory Visit: Payer: Self-pay | Admitting: Family Medicine

## 2019-09-26 DIAGNOSIS — M5412 Radiculopathy, cervical region: Secondary | ICD-10-CM

## 2019-09-30 ENCOUNTER — Ambulatory Visit: Payer: Medicare Other | Attending: Internal Medicine

## 2019-09-30 DIAGNOSIS — Z23 Encounter for immunization: Secondary | ICD-10-CM | POA: Insufficient documentation

## 2019-09-30 NOTE — Progress Notes (Signed)
   Covid-19 Vaccination Clinic  Name:  Roberto George    MRN: [id number] DOB: April 05, 1951  09/30/2019  [name] was observed post Covid-19 immunization for 15 minutes without incidence. He was provided with Vaccine Information Sheet and instruction to access the V-Safe system.   [name] was instructed to call 911 with any severe reactions post vaccine: Marland Kitchen Difficulty breathing  . Swelling of your face and throat  . A fast heartbeat  . A bad rash all over your body  . Dizziness and weakness    Immunizations Administered    Name Date Dose VIS Date Route   Pfizer COVID-19 Vaccine 09/30/2019  8:46 AM 0.3 mL 07/26/2019 Intramuscular   Manufacturer: [place]   Lot: [id number]   [place]: [id number]

## 2019-10-07 ENCOUNTER — Other Ambulatory Visit: Payer: Self-pay

## 2019-10-07 ENCOUNTER — Ambulatory Visit
Admission: RE | Admit: 2019-10-07 | Discharge: 2019-10-07 | Disposition: A | Discharge status: Home / Self Care | Payer: Medicare Other | Source: Ambulatory Visit | Attending: Family Medicine | Admitting: Family Medicine

## 2019-10-07 DIAGNOSIS — M5412 Radiculopathy, cervical region: Secondary | ICD-10-CM

## 2019-10-20 NOTE — Progress Notes (Signed)
Cardiology Office Note:   Date:  10/21/2019  NAME:  Roberto George    MRN: [id number] DOB:  [birth year]   PCP:  Donald Prose, MD  Cardiologist:  Evalina Field, MD   Referring MD: Donald Prose, MD   Chief Complaint  Patient presents with  . Coronary Artery Disease   History of Present Illness:   Roberto George is a 69 y.o. male with a hx of CAD, HTN, HLD who presents for follow-up of CAD. Recent PCI to LAD. Lipids not at goal and referred to lipid clinic.  He reports he is doing well since his heart stent.  He reports he still gets short of breath with very vigorous activity but walking and doing light activity does not get him winded.  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  He reports he is got a few jobs on [employer].  He has had some minor bruising which he has noticed being on aspirin and Plavix.  No major bleeding issues.  He reports no significant chest pain.  No lower extremity edema.  His blood pressure has been on the low side.  We did reduce his lisinopril once.  We will reduce it again today.  Overall, doing well.  He does report some numbness and tingling in his hands bilaterally.  This can occur with activity.  He does work still.  He has seen a neurosurgeon who thinks he has carpal tunnel.  He will undergo testing for that.  Problem List 1. CAD -PCI to proximal LAD 05/07/2019 -normal LCX/RCA -after positive stress test  2. HTN 3. HLD -T chol 172, LDL 104, HDL 46, TG 121  Past Medical History: Past Medical History:  Diagnosis Date  . Allergic rhinitis   . Arthritis   . Complication of anesthesia    slow to wake up  . Diverticulosis of colon (without mention of hemorrhage) 06/10/2004   Dr Erskine Emery  . GERD (gastroesophageal reflux disease)   . Hemorrhoids   . History of kidney stones   . Hyperlipidemia   . Hypertension   . Multiple lipomas    back and scalp  . Presence of drug coated stent in LAD coronary artery 05/07/2019   Resolut Onyx DES (near ostial prox  LAD crossing D1) 2.75 mm x 26 mm - 3.1 mm  . Rheumatic fever     Past Surgical History: Past Surgical History:  Procedure Laterality Date  . CORONARY STENT INTERVENTION N/A 05/07/2019   Procedure: CORONARY STENT INTERVENTION;  Surgeon: Leonie Man, MD;  Location: [facility];  Service: Cardiovascular;  Laterality: N/A;  . LEFT HEART CATH AND CORONARY ANGIOGRAPHY N/A 05/07/2019   Procedure: LEFT HEART CATH AND CORONARY ANGIOGRAPHY;  Surgeon: Leonie Man, MD;  Location: [facility];  Service: Cardiovascular;  Laterality: N/A;  . MASS EXCISION N/A 04/08/2014   Procedure: EXCISION OF POSTERIOR SCALP MASS AND EXC OF BACK MASS;  Surgeon: Edward Jolly, MD;  Location: [facility];  Service: General;  Laterality: N/A;  . NASAL SEPTUM SURGERY  >10 yrs ago  . TONSILLECTOMY AND ADENOIDECTOMY      Current Medications: Current Meds  Medication Sig  . amLODipine (NORVASC) 10 MG tablet Take 1 tablet (10 mg total) by mouth daily.  Marland Kitchen aspirin EC 81 MG tablet Take 81 mg by mouth daily.  Marland Kitchen atorvastatin (LIPITOR) 40 MG tablet Take 40 mg by mouth daily.  . cholecalciferol (VITAMIN D) 1000 UNITS tablet Take 1,000 Units  by mouth daily.   . cyclobenzaprine (FLEXERIL) 10 MG tablet Take 10 mg by mouth at bedtime as needed for muscle spasms.   Marland Kitchen ezetimibe (ZETIA) 10 MG tablet Take 1 tablet (10 mg total) by mouth daily.  . fluticasone (FLONASE) 50 MCG/ACT nasal spray Place 1 spray into both nostrils daily as needed for allergies or rhinitis.  . folic acid (FOLVITE) Q000111Q MCG tablet Take 800 mcg by mouth daily.   Marland Kitchen lisinopril (ZESTRIL) 10 MG tablet Take 1 tablet (10 mg total) by mouth daily.  . nitroGLYCERIN (NITROSTAT) 0.4 MG SL tablet Place 1 tablet (0.4 mg total) under the tongue every 5 (five) minutes as needed.  . pantoprazole (PROTONIX) 40 MG tablet Take 1 tablet (40 mg total) by mouth daily.  . sertraline (ZOLOFT) 50 MG tablet Take 50 mg by mouth at bedtime.   . [DISCONTINUED]  clopidogrel (PLAVIX) 75 MG tablet Take 1 tablet (75 mg total) by mouth daily.  . [DISCONTINUED] lisinopril (ZESTRIL) 20 MG tablet Take 1 tablet (20 mg total) by mouth daily.     Allergies:    Erythromycin and Niacin and related   Social History: Social History   Socioeconomic History  . Marital status: Married    Spouse name: Not on file  . Number of children: 2  . Years of education: Not on file  . Highest education level: Not on file  Occupational History  . Occupation: retired  Tobacco Use  . Smoking status: Former Smoker    Years: 6.00    Quit date: 08/15/1970    Years since quitting: 49.2  . Smokeless tobacco: Never Used  Substance and Sexual Activity  . Alcohol use: Yes    Comment: 12 beers on the weekend  . Drug use: No  . Sexual activity: Not on file  Other Topics Concern  . Not on file  Social History Narrative  . Not on file   Social Determinants of Health   Financial Resource Strain:   . Difficulty of Paying Living Expenses: Not on file  Food Insecurity:   . Worried About Charity fundraiser in the Last Year: Not on file  . Ran Out of Food in the Last Year: Not on file  Transportation Needs:   . Lack of Transportation (Medical): Not on file  . Lack of Transportation (Non-Medical): Not on file  Physical Activity:   . Days of Exercise per Week: Not on file  . Minutes of Exercise per Session: Not on file  Stress:   . Feeling of Stress : Not on file  Social Connections:   . Frequency of Communication with Friends and Family: Not on file  . Frequency of Social Gatherings with Friends and Family: Not on file  . Attends Religious Services: Not on file  . Active Member of Clubs or Organizations: Not on file  . Attends Archivist Meetings: Not on file  . Marital Status: Not on file     Family History: The patient's family history includes Heart attack in his maternal grandmother and mother; Stroke in his father.  ROS:   All other ROS reviewed  and negative. Pertinent positives noted in the HPI.     EKGs/Labs/Other Studies Reviewed:   The following studies were personally reviewed by me today:  TTE 04/19/2019 1. The left ventricle has normal systolic function, with an ejection  fraction of 55-60%. The cavity size was normal. Left ventricular diastolic  parameters were normal. No evidence of left ventricular regional wall  motion abnormalities.  2. The right ventricle has normal systolic function. The cavity was  normal. There is no increase in right ventricular wall thickness.  3. The aorta is normal unless otherwise noted.   LHC 05/07/2019 - > PCI to prox LAD. Normal LCX/RCA  Recent Labs: 05/03/2019: BUN 13; Creatinine, Ser 1.17; Hemoglobin 13.4; Platelets 165; Potassium 4.8; Sodium 141 07/25/2019: ALT 38   Recent Lipid Panel    Component Value Date/Time   CHOL 172 07/25/2019 0852   TRIG 121 07/25/2019 0852   HDL 46 07/25/2019 0852   CHOLHDL 3.7 07/25/2019 0852   LDLCALC 104 (H) 07/25/2019 0852    Physical Exam:   VS:  BP 116/72   Pulse 72   Ht 5\' 11"  (1.803 m)   Wt 197 lb (89.4 kg)   SpO2 97%   BMI 27.48 kg/m    Wt Readings from Last 3 Encounters:  10/21/19 197 lb (89.4 kg)  09/23/19 195 lb (88.5 kg)  05/17/19 192 lb (87.1 kg)    General: Well nourished, well developed, in no acute distress Heart: Atraumatic, normal size  Eyes: PEERLA, EOMI  Neck: Supple, no JVD Endocrine: No thryomegaly Cardiac: Normal S1, S2; RRR; no murmurs, rubs, or gallops Lungs: Clear to auscultation bilaterally, no wheezing, rhonchi or rales  Abd: Soft, nontender, no hepatomegaly  Ext: No edema, pulses 2+ Musculoskeletal: No deformities, BUE and BLE strength normal and equal Skin: Warm and dry, no rashes   Neuro: Alert and oriented to person, place, time, and situation, CNII-XII grossly intact, no focal deficits  Psych: Normal mood and affect   ASSESSMENT:   Roberto George is a 69 y.o. male who presents for the  following: 1. Coronary artery disease involving native coronary artery of native heart without angina pectoris   2. Essential hypertension   3. Mixed hyperlipidemia     PLAN:   1. Coronary artery disease involving native coronary artery of native heart without angina pectoris -Status post PCI to proximal LAD 05/06/2020 -We will do 6 months of DAPT.  He can drop Plavix on 11/04/2019 -He will continue aspirin -Blood pressure well controlled today.  Most recent LDL 104.  Would like it less than 70.  He recently saw Dr. Debara Pickett who added Zetia.  We will recheck a lipid profile in 2 weeks.  I will plan to see him back in 6 months.  I will make any medication changes by phone in the interim.  2. Essential hypertension -We will reduce his lisinopril to 10 mg daily.  He will continue amlodipine 10 mg daily  3. Mixed hyperlipidemia -Most recent LDL 104.  He is on Lipitor 40 mg daily and Zetia 10 mg daily.  Recheck lipid profile in 2 weeks.  I will follow-up the results of this by phone.   Disposition: Return in about 6 months (around 04/22/2020).  Medication Adjustments/Labs and Tests Ordered: Current medicines are reviewed at length with the patient today.  Concerns regarding medicines are outlined above.  Orders Placed This Encounter  Procedures  . Lipid panel   Meds ordered this encounter  Medications  . lisinopril (ZESTRIL) 10 MG tablet    Sig: Take 1 tablet (10 mg total) by mouth daily.    Dispense:  90 tablet    Refill:  1    Dose change    Patient Instructions  Medication Instructions:  Decrease Lisinopril to 10 mg daily  Stop Plavix on 11/04/19 *If you need a refill on your cardiac medications before  your next appointment, please call your pharmacy*   Lab Work: Lipid panel (fasting in 2 weeks) Attached are the lab orders that are needed before your upcoming appointment   They are fasting labs, so nothing to eat or drink after midnight.  Lab hours: 8:00-4:00 lunch hours  12:45-1:45  If you have labs (blood work) drawn today and your tests are completely normal, you will receive your results only by: Marland Kitchen MyChart Message (if you have MyChart) OR . A paper copy in the mail If you have any lab test that is abnormal or we need to change your treatment, we will call you to review the results.   Follow-Up: At [facility], you and your health needs are our priority.  As part of our continuing mission to provide you with exceptional heart care, we have created designated Provider Care Teams.  These Care Teams include your primary Cardiologist (physician) and Advanced Practice Providers (APPs -  Physician Assistants and Nurse Practitioners) who all work together to provide you with the care you need, when you need it.  We recommend signing up for the patient portal called "MyChart".  Sign up information is provided on this After Visit Summary.  MyChart is used to connect with patients for Virtual Visits (Telemedicine).  Patients are able to view lab/test results, encounter notes, upcoming appointments, etc.  Non-urgent messages can be sent to your provider as well.   To learn more about what you can do with MyChart, go to [email or web address].    Your next appointment:   6 month(s)  The format for your next appointment:   In Person  Provider:   Eleonore Chiquito, MD       Time Spent with Patient: I have spent a total of 35 minutes with patient reviewing hospital notes, telemetry, EKGs, labs and examining the patient as well as establishing an assessment and plan that was discussed with the patient.  > 50% of time was spent in direct patient care.  Signed, Addison Naegeli. Audie Box, [place]  [address], [place], [place] [address] [phone]  10/21/2019 8:42 AM

## 2019-10-21 ENCOUNTER — Ambulatory Visit: Payer: Medicare Other | Admitting: Cardiovascular Disease

## 2019-10-21 ENCOUNTER — Other Ambulatory Visit: Payer: Self-pay

## 2019-10-21 ENCOUNTER — Encounter: Payer: Self-pay | Admitting: Cardiovascular Disease

## 2019-10-21 VITALS — BP 116/72 | HR 72 | Ht 71.0 in | Wt 197.0 lb

## 2019-10-21 DIAGNOSIS — E782 Mixed hyperlipidemia: Secondary | ICD-10-CM | POA: Diagnosis not present

## 2019-10-21 DIAGNOSIS — I10 Essential (primary) hypertension: Secondary | ICD-10-CM | POA: Diagnosis not present

## 2019-10-21 DIAGNOSIS — I2510 Atherosclerotic heart disease of native coronary artery without angina pectoris: Secondary | ICD-10-CM | POA: Diagnosis not present

## 2019-10-21 MED ORDER — LISINOPRIL 10 MG PO TABS: 10.0000 mg | ORAL_TABLET | Freq: Every day | ORAL | 1 refills | Status: DC

## 2019-10-21 NOTE — Patient Instructions (Signed)
Medication Instructions:  Decrease Lisinopril to 10 mg daily  Stop Plavix on 11/04/19 *If you need a refill on your cardiac medications before your next appointment, please call your pharmacy*   Lab Work: Lipid panel (fasting in 2 weeks) Attached are the lab orders that are needed before your upcoming appointment   They are fasting labs, so nothing to eat or drink after midnight.  Lab hours: 8:00-4:00 lunch hours 12:45-1:45  If you have labs (blood work) drawn today and your tests are completely normal, you will receive your results only by: Marland Kitchen MyChart Message (if you have MyChart) OR . A paper copy in the mail If you have any lab test that is abnormal or we need to change your treatment, we will call you to review the results.   Follow-Up: At [facility], you and your health needs are our priority.  As part of our continuing mission to provide you with exceptional heart care, we have created designated Provider Care Teams.  These Care Teams include your primary Cardiologist (physician) and Advanced Practice Providers (APPs -  Physician Assistants and Nurse Practitioners) who all work together to provide you with the care you need, when you need it.  We recommend signing up for the patient portal called "MyChart".  Sign up information is provided on this After Visit Summary.  MyChart is used to connect with patients for Virtual Visits (Telemedicine).  Patients are able to view lab/test results, encounter notes, upcoming appointments, etc.  Non-urgent messages can be sent to your provider as well.   To learn more about what you can do with MyChart, go to [email or web address].    Your next appointment:   6 month(s)  The format for your next appointment:   In Person  Provider:   Eleonore Chiquito, MD

## 2019-10-24 ENCOUNTER — Telehealth: Payer: Self-pay | Admitting: Cardiovascular Disease

## 2019-10-24 NOTE — Telephone Encounter (Signed)
Pt aware and will decrease Amlodipine .Roberto George

## 2019-10-24 NOTE — Telephone Encounter (Signed)
Per pt B/P are still running low even with decrease of Lisinopril Pt also is complaining with SOB with strenuous activity   Per pt  weight is stable and no  swelling noted Pt was wandering if could cut the Amlodipine in half as feels the SOB is side effect from this med Will forward message to Dr Marisue Ivan for review and recommendations

## 2019-10-24 NOTE — Telephone Encounter (Signed)
Pt c/o medication issue:  1. Name of Medication: amLODipine (NORVASC) 10 MG tablet(Expired)  2. How are you currently taking this medication (dosage and times per day)? As directed  3. Are you having a reaction (difficulty breathing--STAT)? yes  4. What is your medication issue? Patient states that per his last OV with Dr. Audie Box, his lisinopril was cut in half due to low BP. Ever since then his BP has been lower than what it was at the [facility]. He states his BP has been around 106/68. He states that he is on the max strength for amLODipine and that he is still SOB and these are side effects for this medication. He wants to know if he can cut this pill in half.

## 2019-10-24 NOTE — Telephone Encounter (Signed)
Ok to cut in half. If still no better, would recommend to stop all together.   Roberto George, [name]  [address] [place], [place] [address] [phone]  2:09 PM

## 2020-04-06 ENCOUNTER — Other Ambulatory Visit: Payer: Self-pay

## 2020-04-06 ENCOUNTER — Telehealth: Payer: Self-pay | Admitting: Cardiovascular Disease

## 2020-04-06 MED ORDER — LISINOPRIL 20 MG PO TABS: 20.0000 mg | ORAL_TABLET | Freq: Every day | ORAL | 3 refills | Status: AC

## 2020-04-06 NOTE — Telephone Encounter (Signed)
Refill 20 mg once daily.  [place] T. Audie Box, [place]  [address], [place], [place] [address] [phone]  8:58 AM

## 2020-04-06 NOTE — Progress Notes (Signed)
Pt called and informed that new Rx of 20 mg daily [facility]ril sent to [facility].

## 2020-04-06 NOTE — Telephone Encounter (Signed)
Patient states that the last prescription of Lisinopril filled by Dr. Audie Box was 20mg . He states that at 20mg , his BP has been almost perfect. He is requesting the 20mg  continue to be filled. If the 20mg  can be filled, refill information is below.     *STAT* If patient is at the pharmacy, call can be transferred to refill team.   1. Which medications need to be refilled? (please list name of each medication and dose if known)? lisinopril (ZESTRIL) 10 MG tablet  2. Which pharmacy/location (including street and city if local pharmacy) is medication to be sent to? [id number] [place], [place] - [address]  3. Do they need a 30 day or 90 day supply? 90 day

## 2020-04-20 NOTE — Progress Notes (Signed)
Cardiology Office Note:   Date:  04/22/2020  NAME:  Roberto George    MRN: [id number] DOB:  June 12, 1951   PCP:  Donald Prose, MD  Cardiologist:  Evalina Field, MD   Referring MD: Donald Prose, MD   Chief Complaint  Patient presents with  . Dizziness    History of Present Illness:   Roberto George is a 69 y.o. male with a hx of CAD s/p PCI to pLAD, HTN, HLD who presents for follow-up. Issues with BP and back on lisinopril 20 mg. Zetia added and need to recheck lipid profile. Should be off plavix.  He reports he has been doing fairly well.  He does get dizziness with standing.  Blood pressures at home have ranged around 118-120.  He is not had any high values.  He is still on amlodipine 5 mg daily and lisinopril 20.  He is not drinking as much water as he should.  He reports he is not exercising routinely but still doing a lot of contract work.  He is working mainly at [employer].  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  He does activity around the project site but does not have any structured exercise.  He denies any chest pain, shortness of breath or palpitations with his current level of activity.  His most recent lipid profile from his primary care physician demonstrates a total cholesterol 166, HDL 55, LDL 89, triglycerides 122.  He is still not at goal despite Lipitor 40 and Zetia 10.  We did discuss options such as bempedoic acid versus a PCSK9 inhibitor.  He reports he is interested in a PCSK9 inhibitor.  We will refer him today to the PCSK9 clinic.  Overall doing well.  Problem List 1. CAD -PCI to proximal LAD 05/07/2019 -normal LCX/RCA -after positive stress test  2. HTN 3. HLD -T chol 166, HDL 55, LDL 89, triglycerides 122  Past Medical History: Past Medical History:  Diagnosis Date  . Allergic rhinitis   . Arthritis   . Complication of anesthesia    slow to wake up  . Diverticulosis of colon (without mention of hemorrhage) 06/10/2004   Dr Erskine Emery  . GERD  (gastroesophageal reflux disease)   . Hemorrhoids   . History of kidney stones   . Hyperlipidemia   . Hypertension   . Multiple lipomas    back and scalp  . Presence of drug coated stent in LAD coronary artery 05/07/2019   Resolut Onyx DES (near ostial prox LAD crossing D1) 2.75 mm x 26 mm - 3.1 mm  . Rheumatic fever     Past Surgical History: Past Surgical History:  Procedure Laterality Date  . CORONARY STENT INTERVENTION N/A 05/07/2019   Procedure: CORONARY STENT INTERVENTION;  Surgeon: Leonie Man, MD;  Location: [facility];  Service: Cardiovascular;  Laterality: N/A;  . LEFT HEART CATH AND CORONARY ANGIOGRAPHY N/A 05/07/2019   Procedure: LEFT HEART CATH AND CORONARY ANGIOGRAPHY;  Surgeon: Leonie Man, MD;  Location: [facility];  Service: Cardiovascular;  Laterality: N/A;  . MASS EXCISION N/A 04/08/2014   Procedure: EXCISION OF POSTERIOR SCALP MASS AND EXC OF BACK MASS;  Surgeon: Edward Jolly, MD;  Location: [facility];  Service: General;  Laterality: N/A;  . NASAL SEPTUM SURGERY  >10 yrs ago  . TONSILLECTOMY AND ADENOIDECTOMY      Current Medications: Current Meds  Medication Sig  . aspirin EC 81 MG tablet Take 81  mg by mouth daily.  Marland Kitchen atorvastatin (LIPITOR) 40 MG tablet Take 40 mg by mouth daily.  . cholecalciferol (VITAMIN D) 1000 UNITS tablet Take 1,000 Units by mouth daily.   . Cyanocobalamin (VITAMIN B-12 PO) Take 1,000 mg by mouth.  . cyclobenzaprine (FLEXERIL) 10 MG tablet Take 10 mg by mouth at bedtime as needed for muscle spasms.   Marland Kitchen ezetimibe (ZETIA) 10 MG tablet Take 1 tablet (10 mg total) by mouth daily.  . fluticasone (FLONASE) 50 MCG/ACT nasal spray Place 1 spray into both nostrils daily as needed for allergies or rhinitis.  . folic acid (FOLVITE) 784 MCG tablet Take 800 mcg by mouth daily.   Marland Kitchen lisinopril (ZESTRIL) 20 MG tablet Take 1 tablet (20 mg total) by mouth daily.  . nitroGLYCERIN (NITROSTAT) 0.4 MG SL tablet Place 1 tablet (0.4  mg total) under the tongue every 5 (five) minutes as needed.  . sertraline (ZOLOFT) 50 MG tablet Take 50 mg by mouth at bedtime.   . [DISCONTINUED] amLODipine (NORVASC) 5 MG tablet Take 5 mg by mouth daily.     Allergies:    Erythromycin and Niacin and related   Social History: Social History   Socioeconomic History  . Marital status: Married    Spouse name: Not on file  . Number of children: 2  . Years of education: Not on file  . Highest education level: Not on file  Occupational History  . Occupation: retired  Tobacco Use  . Smoking status: Former Smoker    Years: 6.00    Quit date: 08/15/1970    Years since quitting: 49.7  . Smokeless tobacco: Never Used  Substance and Sexual Activity  . Alcohol use: Yes    Comment: 12 beers on the weekend  . Drug use: No  . Sexual activity: Not on file  Other Topics Concern  . Not on file  Social History Narrative  . Not on file   Social Determinants of Health   Financial Resource Strain:   . Difficulty of Paying Living Expenses: Not on file  Food Insecurity:   . Worried About Charity fundraiser in the Last Year: Not on file  . Ran Out of Food in the Last Year: Not on file  Transportation Needs:   . Lack of Transportation (Medical): Not on file  . Lack of Transportation (Non-Medical): Not on file  Physical Activity:   . Days of Exercise per Week: Not on file  . Minutes of Exercise per Session: Not on file  Stress:   . Feeling of Stress : Not on file  Social Connections:   . Frequency of Communication with Friends and Family: Not on file  . Frequency of Social Gatherings with Friends and Family: Not on file  . Attends Religious Services: Not on file  . Active Member of Clubs or Organizations: Not on file  . Attends Archivist Meetings: Not on file  . Marital Status: Not on file     Family History: The patient's family history includes Heart attack in his maternal grandmother and mother; Stroke in his  father.  ROS:   All other ROS reviewed and negative. Pertinent positives noted in the HPI.     EKGs/Labs/Other Studies Reviewed:   The following studies were personally reviewed by me today:   [place] 05/07/2019 -PCI to pLAD  [facility] 04/19/2019 1. The left ventricle has normal systolic function, with an ejection  fraction of 55-60%. The cavity size was normal. Left ventricular diastolic  parameters were normal. No evidence of left ventricular regional wall  motion abnormalities.  2. The right ventricle has normal systolic function. The cavity was  normal. There is no increase in right ventricular wall thickness.  3. The aorta is normal unless otherwise noted.   Recent Labs: 05/03/2019: BUN 13; Creatinine, Ser 1.17; Hemoglobin 13.4; Platelets 165; Potassium 4.8; Sodium 141 07/25/2019: ALT 38   Recent Lipid Panel    Component Value Date/Time   CHOL 172 07/25/2019 0852   TRIG 121 07/25/2019 0852   HDL 46 07/25/2019 0852   CHOLHDL 3.7 07/25/2019 0852   LDLCALC 104 (H) 07/25/2019 0852    Physical Exam:   VS:  BP 128/72   Pulse 61   Ht 5\' 11"  (1.803 m)   Wt 196 lb (88.9 kg)   SpO2 98%   BMI 27.34 kg/m    Wt Readings from Last 3 Encounters:  04/22/20 196 lb (88.9 kg)  10/21/19 197 lb (89.4 kg)  09/23/19 195 lb (88.5 kg)    General: Well nourished, well developed, in no acute distress Heart: Atraumatic, normal size  Eyes: PEERLA, EOMI  Neck: Supple, no JVD Endocrine: No thryomegaly Cardiac: Normal S1, S2; RRR; no murmurs, rubs, or gallops Lungs: Clear to auscultation bilaterally, no wheezing, rhonchi or rales  Abd: Soft, nontender, no hepatomegaly  Ext: No edema, pulses 2+ Musculoskeletal: No deformities, BUE and BLE strength normal and equal Skin: Warm and dry, no rashes   Neuro: Alert and oriented to person, place, time, and situation, CNII-XII grossly intact, no focal deficits  Psych: Normal mood and affect   ASSESSMENT:   ANNETTE LIOTTA is a 69 y.o. male who  presents for the following: 1. Coronary artery disease involving native coronary artery of native heart without angina pectoris   2. Mixed hyperlipidemia   3. Essential hypertension   4. Dizziness     PLAN:   1. Coronary artery disease involving native coronary artery of native heart without angina pectoris -He underwent PCI in September 2020.  He completed 6 months of DAPT.  On aspirin.  No chest pain symptoms. -He is only been able to tolerate Lipitor 40 mg daily.  With high doses he gets cramping.  He is on 10 mg of Zetia.  Most recent LDL cholesterol is 89.  He is not at goal.  We discussed bempedoic acid versus PCSK9 inhibitors.  He is opted for PCSK9 inhibitor.  I will refer him to the pharmacy driven clinic here for this.  2. Mixed hyperlipidemia -Most recent LDL cholesterol 89.  This is on Lipitor 40 and Zetia 10 mg.  He is maxed out when he can tolerate.  Still not at goal.  Referral to [facility].  3. Essential hypertension 4. Dizziness -Intermittent dizziness upon standing.  Symptoms likely related to orthostasis.  BPs likely a bit too low per his report.  We will stop his amlodipine.  He will continue his lisinopril 20 mg daily.  He will keep a log of blood pressure and symptoms.  I also encouraged him to remain adequately hydrated.  Disposition: Return in about 1 year (around 04/22/2021).  Medication Adjustments/Labs and Tests Ordered: Current medicines are reviewed at length with the patient today.  Concerns regarding medicines are outlined above.  Orders Placed This Encounter  Procedures  . AMB Referral to Advanced Lipid Disorders Clinic   No orders of the defined types were placed in this encounter.   Patient Instructions  Medication Instructions:  STOP amlodipine   *  If you need a refill on your cardiac medications before your next appointment, please call your pharmacy*   Follow-Up: At [facility], you and your health needs are our priority.  As part  of our continuing mission to provide you with exceptional heart care, we have created designated Provider Care Teams.  These Care Teams include your primary Cardiologist (physician) and Advanced Practice Providers (APPs -  Physician Assistants and Nurse Practitioners) who all work together to provide you with the care you need, when you need it.  We recommend signing up for the patient portal called "MyChart".  Sign up information is provided on this After Visit Summary.  MyChart is used to connect with patients for Virtual Visits (Telemedicine).  Patients are able to view lab/test results, encounter notes, upcoming appointments, etc.  Non-urgent messages can be sent to your provider as well.   To learn more about what you can do with MyChart, go to [email or web address].    Your next appointment:   12 month(s)  The format for your next appointment:   In Person  Provider:   Eleonore Chiquito, MD   Other Instructions  Referral to PharmD lipid clinic-       Time Spent with Patient: I have spent a total of 35 minutes with patient reviewing hospital notes, telemetry, EKGs, labs and examining the patient as well as establishing an assessment and plan that was discussed with the patient.  > 50% of time was spent in direct patient care.  Signed, Addison Naegeli. Audie Box, Haivana Nakya  [address], [place], [place] [address] [phone]  04/22/2020 10:51 AM

## 2020-04-22 ENCOUNTER — Ambulatory Visit (INDEPENDENT_AMBULATORY_CARE_PROVIDER_SITE_OTHER): Payer: Medicare Other | Admitting: Cardiovascular Disease

## 2020-04-22 ENCOUNTER — Other Ambulatory Visit: Payer: Self-pay

## 2020-04-22 ENCOUNTER — Encounter: Payer: Self-pay | Admitting: Cardiovascular Disease

## 2020-04-22 VITALS — BP 128/72 | HR 61 | Ht 71.0 in | Wt 196.0 lb

## 2020-04-22 DIAGNOSIS — I2510 Atherosclerotic heart disease of native coronary artery without angina pectoris: Secondary | ICD-10-CM

## 2020-04-22 DIAGNOSIS — I10 Essential (primary) hypertension: Secondary | ICD-10-CM | POA: Diagnosis not present

## 2020-04-22 DIAGNOSIS — R42 Dizziness and giddiness: Secondary | ICD-10-CM | POA: Diagnosis not present

## 2020-04-22 DIAGNOSIS — E782 Mixed hyperlipidemia: Secondary | ICD-10-CM

## 2020-04-22 IMAGING — MR MR CERVICAL SPINE W/O CM
4 of 5 series · 30 of 48 positions shown · non-contrast
Comparison: None.

CLINICAL DATA: Pain in the neck with hand numbness at times.

EXAM:
MRI CERVICAL SPINE WITHOUT CONTRAST
TECHNIQUE: Multiplanar, multisequence MR imaging of the cervical spine was
performed. No intravenous contrast was administered.

[Series 2: T2 · sagittal · 3.0mm · 0.41mm/px · 7 of 13 slices shown (1 of 2)]
[im 1/13]
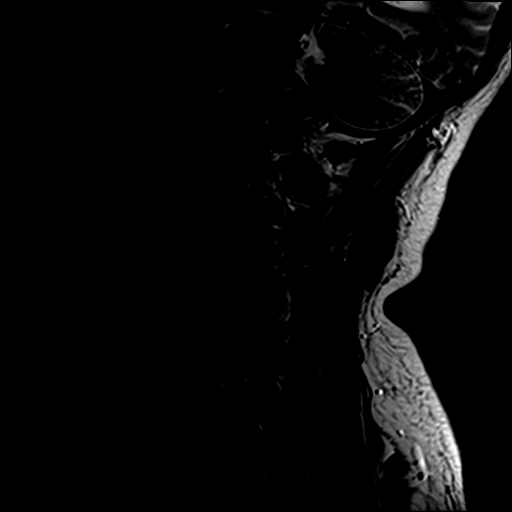
[im 3/13]
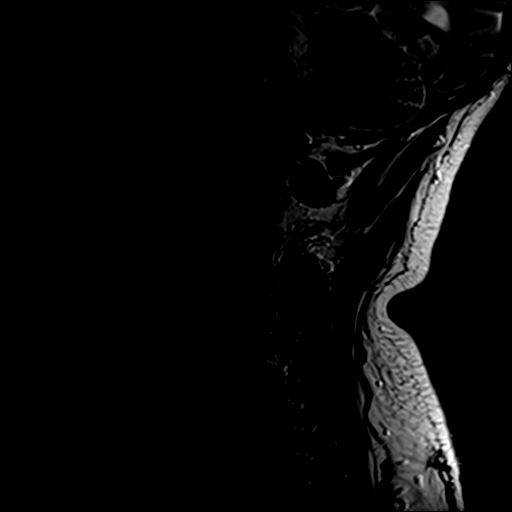
[im 5/13]
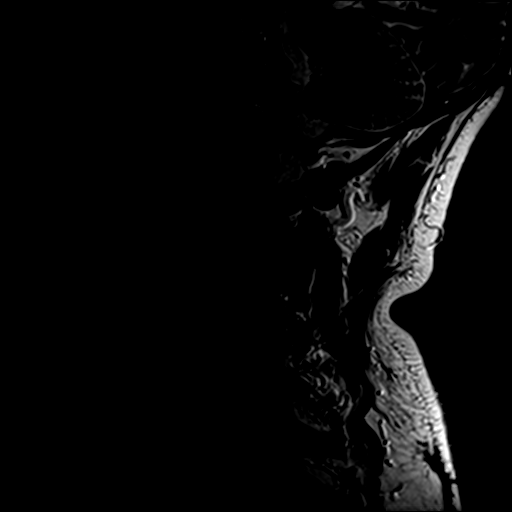
[im 7/13]
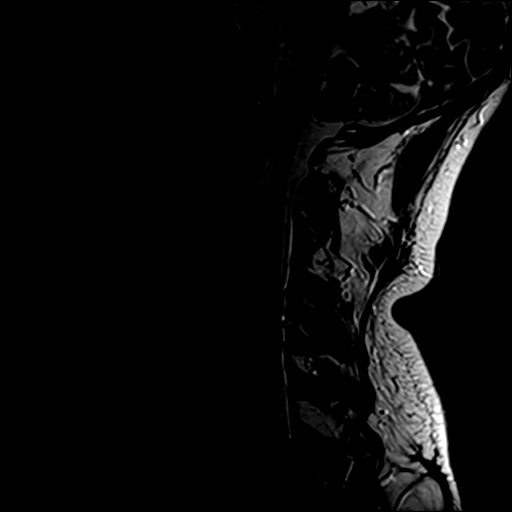
[im 9/13]
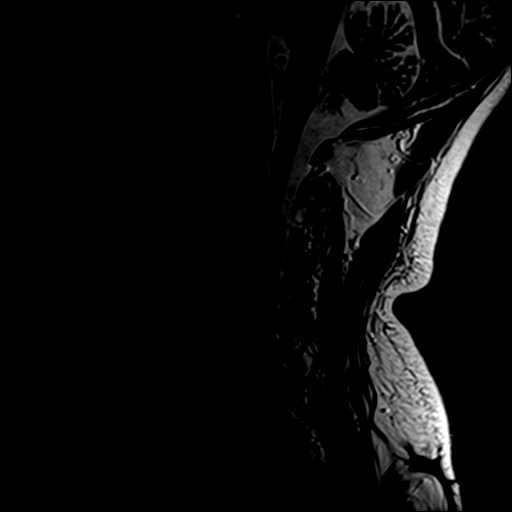
[im 11/13]
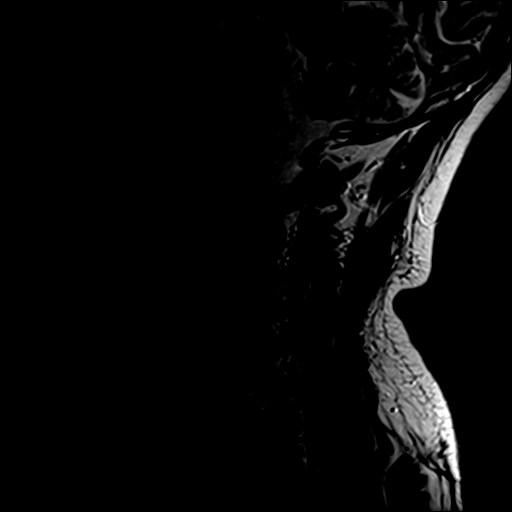
[im 13/13]
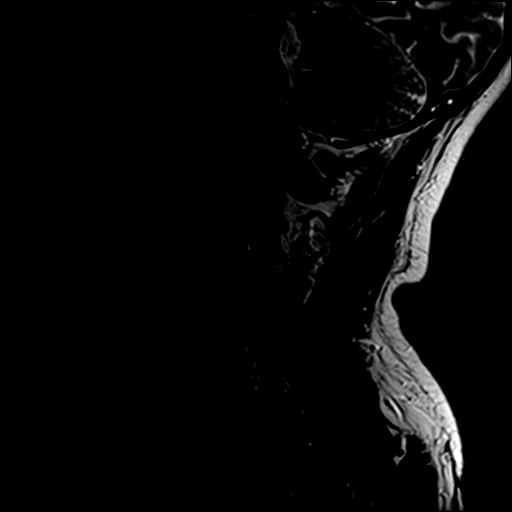

[Series 3: T1 · sagittal · 3.0mm · 0.41mm/px · 7 of 13 slices shown]
[im 1/13]
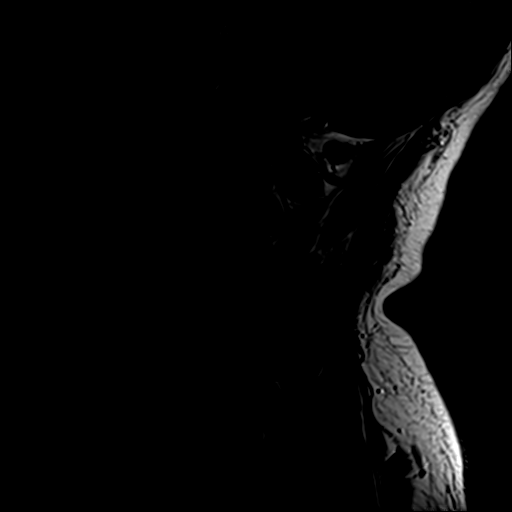
[im 3/13]
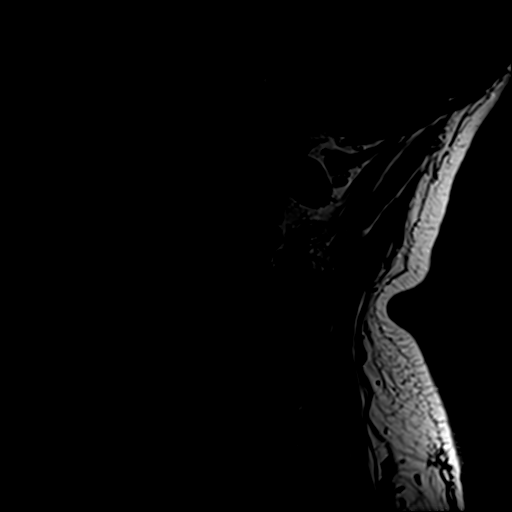
[im 5/13]
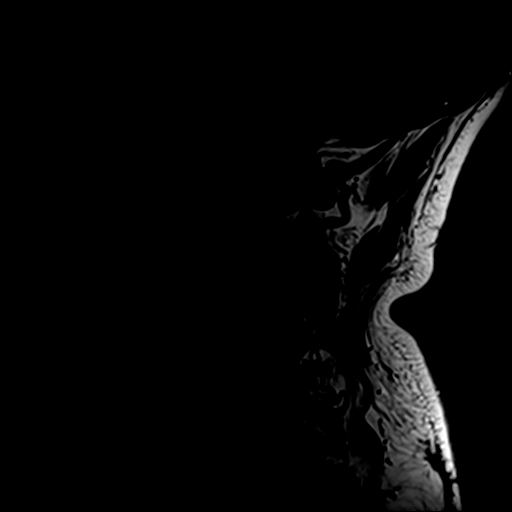
[im 7/13]
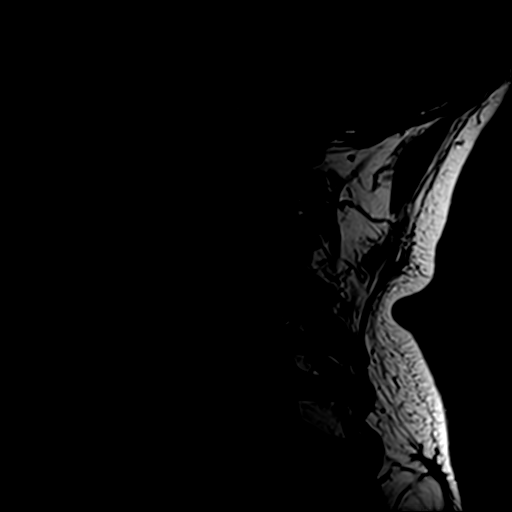
[im 9/13]
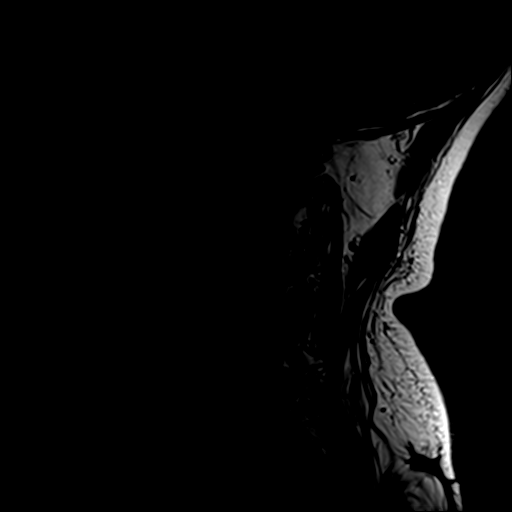
[im 11/13]
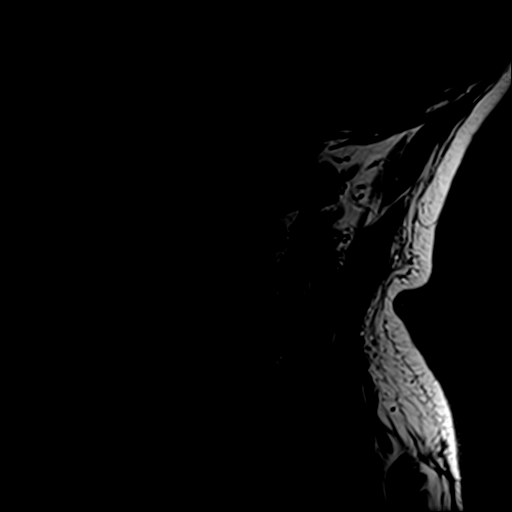
[im 13/13]
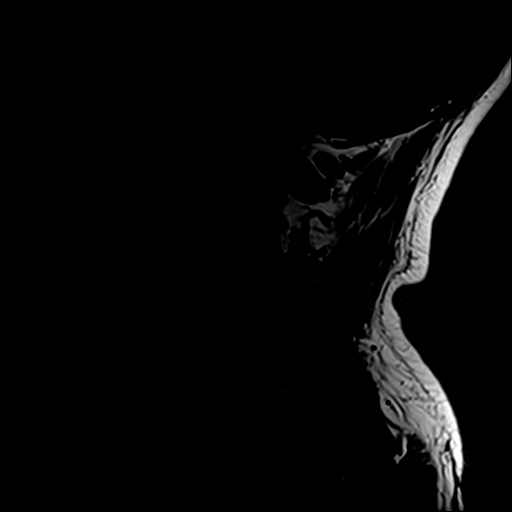

[Series 4: STIR · sagittal · 3.0mm · 0.82mm/px · 7 of 13 slices shown]
[im 1/13]
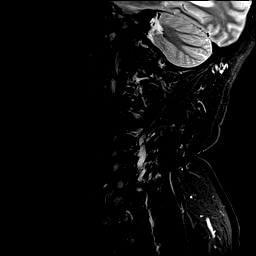
[im 3/13]
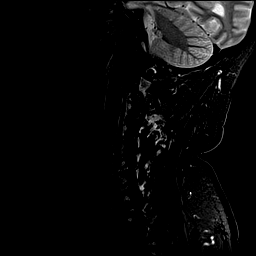
[im 5/13]
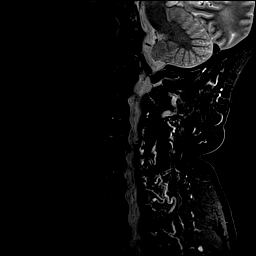
[im 7/13]
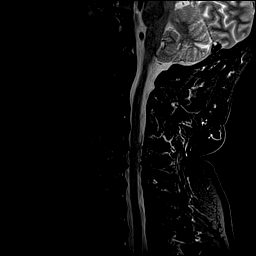
[im 9/13]
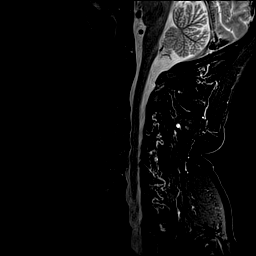
[im 11/13]
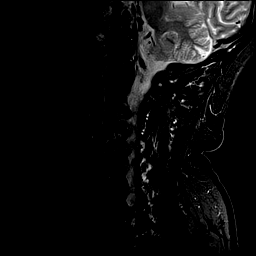
[im 13/13]
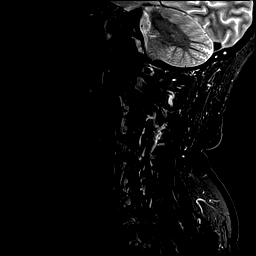

[Series 6: T2 · axial · 3.0mm · 0.70mm/px · z∈[-79,+5]mm · 9 of 24 slices shown (2 of 2)]
[im 1/24]
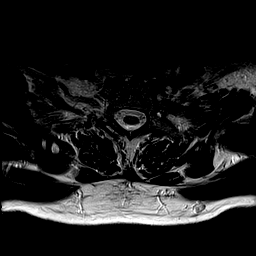
[im 4/24]
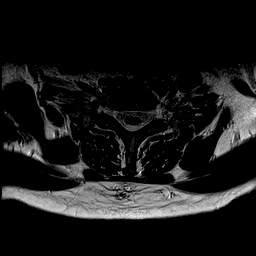
[im 8/24]
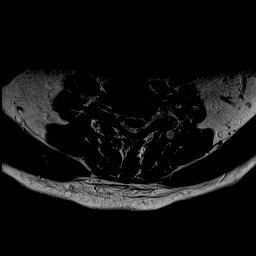
[im 10/24]
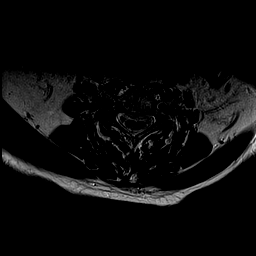
[im 12/24]
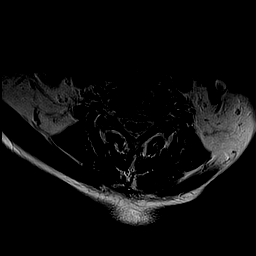
[im 14/24]
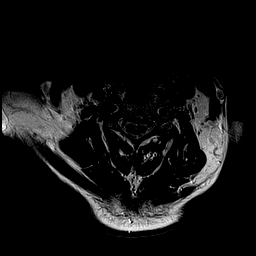
[im 16/24]
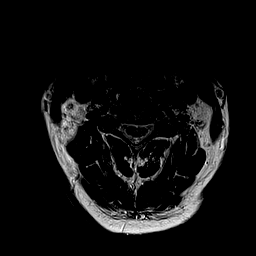
[im 20/24]
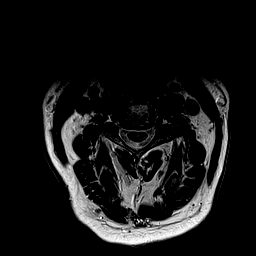
[im 24/24]
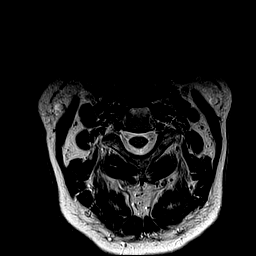

[30 of 48 positions shown; findings below may reference images not displayed]

FINDINGS: Alignment: Straightening of the cervical spine with slight
anterolisthesis at C7-T1

Vertebrae: No fracture, evidence of discitis, or bone lesion.
Degenerative alteration of marrow signal especially at C4-5.

Cord: Normal signal and morphology

Posterior Fossa, vertebral arteries, paraspinal tissues:
Subcutaneous scarring at the level of the upper thoracic spine
posteriorly.

Disc levels:

C2-3: Facet spurring with ankylosis on the left.  No impingement

C3-4: Disc narrowing with uncovertebral ridging. Degenerative facet
spurring. Mild bilateral foraminal narrowing

C4-5: Disc narrowing with endplate and uncovertebral ridging.
Degenerative facet spurring. The canal and foramina are patent based
on gradient imaging. Mild bilateral foraminal narrowing suggested on
axial T2 weighted imaging.

C5-6: Disc narrowing with endplate and uncovertebral ridging that is
asymmetric to the left where there is foraminal impingement. Mild
right foraminal narrowing. Patent spinal canal

C6-7: Disc narrowing with endplate ridging. Negative facets. The
canal and foramina are patent

C7-T1:Unremarkable.
IMPRESSION: 1. Generalized degenerative disease especially of the disc spaces.
Milder facet spurring with left-sided ankylosis at C2-3.
2. Diffusely patent spinal canal.
3. High-grade foraminal impingement on the left at C5-6. Otherwise
mild foraminal narrowing as described above.

## 2020-04-22 NOTE — Patient Instructions (Signed)
Medication Instructions:  STOP amlodipine   *If you need a refill on your cardiac medications before your next appointment, please call your pharmacy*   Follow-Up: At [facility], you and your health needs are our priority.  As part of our continuing mission to provide you with exceptional heart care, we have created designated Provider Care Teams.  These Care Teams include your primary Cardiologist (physician) and Advanced Practice Providers (APPs -  Physician Assistants and Nurse Practitioners) who all work together to provide you with the care you need, when you need it.  We recommend signing up for the patient portal called "MyChart".  Sign up information is provided on this After Visit Summary.  MyChart is used to connect with patients for Virtual Visits (Telemedicine).  Patients are able to view lab/test results, encounter notes, upcoming appointments, etc.  Non-urgent messages can be sent to your provider as well.   To learn more about what you can do with MyChart, go to [email or web address].    Your next appointment:   12 month(s)  The format for your next appointment:   In Person  Provider:   Eleonore Chiquito, MD   Other Instructions  Referral to PharmD lipid clinic-

## 2020-05-04 ENCOUNTER — Other Ambulatory Visit: Payer: Self-pay

## 2020-05-04 ENCOUNTER — Ambulatory Visit (INDEPENDENT_AMBULATORY_CARE_PROVIDER_SITE_OTHER): Payer: Medicare Other | Admitting: Pharmacist Clinician (PhC)/ Clinical Pharmacy Specialist

## 2020-05-04 DIAGNOSIS — E785 Hyperlipidemia, unspecified: Secondary | ICD-10-CM | POA: Diagnosis not present

## 2020-05-04 NOTE — Assessment & Plan Note (Signed)
Patient with elevated LDL cholesterol on maximum tolerated dose of atorvastatin.  Patient previously reviewed options for lowering LDL cholesterol, including PCSK-9 inhibitors and bempedoic acid with Dr. Audie Box.  Today we discussed mechanisms of action, dosing, side effects and potential decreases in LDL cholesterol.  Answered all patient questions.  Based on this information, patient would prefer to start Repatha 140 mg.  Reviewed copay information and possible patient assistance options.  We will reach out to insurance for PA and then once approved, have patient repeat labs after 3 months.

## 2020-05-04 NOTE — Progress Notes (Signed)
05/04/2020 Roberto George [birth year] [id number]   HPI:  Roberto George is a 69 y.o. male patient of Dr Audie Box, who presents today for a lipid clinic evaluation.  See pertinent past medical history below.  Last September he had a stent placed in the proximal LAD.  Has done well since then with no concerns.  He was unable to tolerate atorvastatin 80 mg dose, but has done well with the 40 mg. Unfortunately, even with the addition of ezetimibe, he has not quite reached lipid goals.    He does note that chronic arthritis is a problem, but doesn't think that the current dose of atorvastatin has any impact on that.    Past Medical History: ASCVD S/p PCI to pLAD Sept 2020  hypertension Controlled on lisinopril 20 mg and amlodipine 5 mg   Current Medications: atorvastatin 40, ezetimibe 10 mg  Cholesterol Goals: LDL < 70   Intolerant/previously tried: atorvastatin 80 - myalgias  Family history: mother died from MI at 66; pgf died at 53 from MI; father died from stroke at 29; no siblings; 2 daughters, younger with elevated cholesterol  Diet: more at home, but 3-4 times per week out, has cut out most fast food; does like to go out for breakfast, about twice weekly (eggs, bacon and hash browns); not as many vegetables as would like; admits to snacking - potato chips and occasional ice cream or nabs  Exercise: stays active; yard work, helping with building project  Labs: 5/21:   TC 166, TG 122, HDL 55, LDL 89  Current Outpatient Medications  Medication Sig Dispense Refill  . aspirin EC 81 MG tablet Take 81 mg by mouth daily.    Marland Kitchen atorvastatin (LIPITOR) 40 MG tablet Take 40 mg by mouth daily.    . cholecalciferol (VITAMIN D) 1000 UNITS tablet Take 1,000 Units by mouth daily.     . Cyanocobalamin (VITAMIN B-12 PO) Take 1,000 mg by mouth.    . cyclobenzaprine (FLEXERIL) 10 MG tablet Take 10 mg by mouth at bedtime as needed for muscle spasms.     Marland Kitchen ezetimibe (ZETIA) 10 MG tablet Take 1 tablet (10 mg  total) by mouth daily. 90 tablet 3  . fluticasone (FLONASE) 50 MCG/ACT nasal spray Place 1 spray into both nostrils daily as needed for allergies or rhinitis.    . folic acid (FOLVITE) 092 MCG tablet Take 800 mcg by mouth daily.     Marland Kitchen lisinopril (ZESTRIL) 20 MG tablet Take 1 tablet (20 mg total) by mouth daily. 90 tablet 3  . nitroGLYCERIN (NITROSTAT) 0.4 MG SL tablet Place 1 tablet (0.4 mg total) under the tongue every 5 (five) minutes as needed. 25 tablet 2  . sertraline (ZOLOFT) 50 MG tablet Take 50 mg by mouth at bedtime.      No current facility-administered medications for this visit.    Allergies  Allergen Reactions  . Erythromycin     Mouth broke out with blisters   . Niacin And Related     Flush     Past Medical History:  Diagnosis Date  . Allergic rhinitis   . Arthritis   . Complication of anesthesia    slow to wake up  . Diverticulosis of colon (without mention of hemorrhage) 06/10/2004   Dr Erskine Emery  . GERD (gastroesophageal reflux disease)   . Hemorrhoids   . History of kidney stones   . Hyperlipidemia   . Hypertension   . Multiple lipomas    back and scalp  .  Presence of drug coated stent in LAD coronary artery 05/07/2019   Resolut Onyx DES (near ostial prox LAD crossing D1) 2.75 mm x 26 mm - 3.1 mm  . Rheumatic fever     Blood pressure (!) 152/86, pulse 73, resp. rate 15, height 5\' 11"  (1.803 m), weight 195 lb 12.8 oz (88.8 kg), SpO2 96 %.   Hyperlipidemia Patient with elevated LDL cholesterol on maximum tolerated dose of atorvastatin.  Patient previously reviewed options for lowering LDL cholesterol, including PCSK-9 inhibitors and bempedoic acid with Dr. Audie Box.  Today we discussed mechanisms of action, dosing, side effects and potential decreases in LDL cholesterol.  Answered all patient questions.  Based on this information, patient would prefer to start Repatha 140 mg.  Reviewed copay information and possible patient assistance options.  We will reach  out to insurance for PA and then once approved, have patient repeat labs after 3 months.    Tommy Medal PharmD CPP [facility] [address] [place], [place] [address] [phone]

## 2020-05-04 NOTE — Patient Instructions (Addendum)
Your Results:             Your most recent labs Goal  Total Cholesterol 166 < 200  Triglycerides 122 < 150  HDL (happy/good cholesterol) 55 > 40  LDL (lousy/bad cholesterol 89 < 70      Medication changes:  We will get approval from your insurance for Repatha SureClick 586 mg.  Once approved, you will do one injection every 14 days.    Lab orders:  Repeat labs after 5-6 injections (about 3 months)  Patient Assistance:  The [employer] foundation offers assistance to help pay for medication copays.  They will cover copays for all cholesterol lowering meds, including statins, fibrates, omega-3 oils, ezetimibe, Repatha, Praluent, Nexletol, Nexlizet.  The cards are usually good for $2,500 or 12 months, whichever comes first. 1. Go to healthwellfoundation.org 2. Click on "Apply Now" 3. Answer questions as to whom is applying (patient or representative) 4. Your disease fund will be "hypercholesterolemia - Medicare access" 5. They will ask questions about finances and which medications you are taking for cholesterol 6. When you submit, the approval is usually within minutes.  You will need to print the card information from the site 7. You will need to show this information to your pharmacy, they will bill your Medicare Part D plan first -then bill [employer] --for the copay.   You can also call them at [phone], although the hold times can be quite long.   Thank you for choosing [facility]

## 2020-05-13 ENCOUNTER — Telehealth: Payer: Self-pay | Admitting: Cardiovascular Disease

## 2020-05-13 NOTE — Telephone Encounter (Signed)
Patient states he was supposed to go on repatha, but he has not heard anything about it.

## 2020-05-13 NOTE — Telephone Encounter (Signed)
Called and lmomed the pt saying that we are working on a appeal and will be in touch soon

## 2020-05-19 ENCOUNTER — Telehealth: Payer: Self-pay

## 2020-05-19 DIAGNOSIS — E785 Hyperlipidemia, unspecified: Secondary | ICD-10-CM

## 2020-05-19 MED ORDER — REPATHA SURECLICK 140 MG/ML ~~LOC~~ SOAJ: 140.0000 mg | SUBCUTANEOUS | 11 refills | Status: DC

## 2020-05-19 NOTE — Telephone Encounter (Signed)
Called and spoke w/pt regarding starting the repatha and that they should come in to complete lipid labs after their fourth injection. Pt voiced understanding

## 2020-06-05 ENCOUNTER — Other Ambulatory Visit: Payer: Self-pay | Admitting: Cardiovascular Disease

## 2020-07-05 ENCOUNTER — Other Ambulatory Visit: Payer: Self-pay

## 2020-07-05 ENCOUNTER — Ambulatory Visit
Admission: EM | Admit: 2020-07-05 | Discharge: 2020-07-05 | Disposition: A | Discharge status: Home / Self Care | Payer: Medicare Other | Attending: Physician Assistant | Admitting: Physician Assistant

## 2020-07-05 DIAGNOSIS — M25561 Pain in right knee: Secondary | ICD-10-CM | POA: Diagnosis not present

## 2020-07-05 MED ORDER — MELOXICAM 7.5 MG PO TABS: 7.5000 mg | ORAL_TABLET | Freq: Every day | ORAL | 0 refills | Status: DC

## 2020-07-05 NOTE — Discharge Instructions (Signed)
Start Mobic. Do not take ibuprofen (motrin/advil)/ naproxen (aleve) while on mobic. Ice compress, knee sleeve as directed. Follow up with orthopedics if symptoms not improving.

## 2020-07-05 NOTE — ED Triage Notes (Signed)
Pt states he began experiencing knee pain yesterday and it has been worsening. Pt states the pain is worse when sitting still than when he is standing or walking. Pt states pain is fairly constant. Pt is aox4 and ambulatory.

## 2020-07-05 NOTE — ED Provider Notes (Signed)
[name]    CSN: [id number] Arrival date & time: 07/05/20  2376      History   Chief Complaint Chief Complaint  Patient presents with  . Knee Pain    right since yesterday    HPI Roberto George is a 69 y.o. male.   69 year old male comes in for 2 day history of right knee pain. Denies injury/trauma. Was stepping off lawn mower when he first felt it. However, did state for the past few weeks, can feel some catching to the right knee at times. Pain is constant, worse with bending the knee.      Past Medical History:  Diagnosis Date  . Allergic rhinitis   . Arthritis   . Complication of anesthesia    slow to wake up  . Diverticulosis of colon (without mention of hemorrhage) 06/10/2004   Dr Erskine Emery  . GERD (gastroesophageal reflux disease)   . Hemorrhoids   . History of kidney stones   . Hyperlipidemia   . Hypertension   . Multiple lipomas    back and scalp  . Presence of drug coated stent in LAD coronary artery 05/07/2019   Resolut Onyx DES (near ostial prox LAD crossing D1) 2.75 mm x 26 mm - 3.1 mm  . Rheumatic fever     Patient Active Problem List   Diagnosis Date Noted  . Dyspnea on exertion 05/07/2019  . Atypical angina ([place]) 05/07/2019  . Abnormal nuclear stress test 05/07/2019  . Presence of drug coated stent in LAD coronary artery 05/07/2019  . Coronary artery disease involving native coronary artery of native heart with angina pectoris ([place])   . Diverticulosis 08/28/2012  . GERD (gastroesophageal reflux disease) 08/28/2012  . HTN (hypertension) 08/28/2012  . Hyperlipidemia 08/28/2012    Past Surgical History:  Procedure Laterality Date  . CORONARY STENT INTERVENTION N/A 05/07/2019   Procedure: CORONARY STENT INTERVENTION;  Surgeon: Leonie Man, MD;  Location: [facility];  Service: Cardiovascular;  Laterality: N/A;  . LEFT HEART CATH AND CORONARY ANGIOGRAPHY N/A 05/07/2019   Procedure: LEFT HEART CATH AND CORONARY  ANGIOGRAPHY;  Surgeon: Leonie Man, MD;  Location: [facility];  Service: Cardiovascular;  Laterality: N/A;  . MASS EXCISION N/A 04/08/2014   Procedure: EXCISION OF POSTERIOR SCALP MASS AND EXC OF BACK MASS;  Surgeon: Edward Jolly, MD;  Location: [facility];  Service: General;  Laterality: N/A;  . NASAL SEPTUM SURGERY  >10 yrs ago  . TONSILLECTOMY AND ADENOIDECTOMY         Home Medications    Prior to Admission medications   Medication Sig Start Date End Date Taking? Authorizing Provider  aspirin EC 81 MG tablet Take 81 mg by mouth daily.   Yes [provider]  atorvastatin (LIPITOR) 40 MG tablet Take 40 mg by mouth daily.   Yes [provider]  cholecalciferol (VITAMIN D) 1000 UNITS tablet Take 1,000 Units by mouth daily.    Yes [provider]  Cyanocobalamin (VITAMIN B-12 PO) Take 1,000 mg by mouth.   Yes [provider]  cyclobenzaprine (FLEXERIL) 10 MG tablet Take 10 mg by mouth at bedtime as needed for muscle spasms.  02/14/19  Yes [provider]  ezetimibe (ZETIA) 10 MG tablet Take 1 tablet (10 mg total) by mouth daily. 09/23/19 05/04/21 Yes Hilty, Nadean Corwin, MD  fluticasone (FLONASE) 50 MCG/ACT nasal spray Place 1 spray into both nostrils daily as needed for allergies or  rhinitis.   Yes [provider]  folic acid (FOLVITE) 536 MCG tablet Take 800 mcg by mouth daily.    Yes [provider]  lisinopril (ZESTRIL) 20 MG tablet Take 1 tablet (20 mg total) by mouth daily. 04/06/20 07/05/20 Yes O'Neal, Cassie Freer, MD  sertraline (ZOLOFT) 50 MG tablet Take 50 mg by mouth at bedtime.    Yes [provider]  meloxicam (MOBIC) 7.5 MG tablet Take 1-2 tablets (7.5-15 mg total) by mouth daily. 07/05/20   Tasia Catchings, Arlene Genova V, PA-C  nitroGLYCERIN (NITROSTAT) 0.4 MG SL tablet Place 1 tablet (0.4 mg total) under the tongue every 5 (five) minutes as needed. 05/07/19   Cheryln Manly, NP    Family History Family History    Problem Relation Age of Onset  . Heart attack Mother   . Heart attack Maternal Grandmother   . Stroke Father     Social History Social History   Tobacco Use  . Smoking status: Former Smoker    Years: 6.00    Quit date: 08/15/1970    Years since quitting: 49.9  . Smokeless tobacco: Never Used  Vaping Use  . Vaping Use: Never used  Substance Use Topics  . Alcohol use: Yes    Comment: 12 beers on the weekend  . Drug use: No     Allergies   Erythromycin and Niacin and related   Review of Systems Review of Systems  Reason unable to perform ROS: See HPI as above.     Physical Exam Triage Vital Signs ED Triage Vitals  Enc Vitals Group     BP 07/05/20 0933 (!) 150/96     Pulse Rate 07/05/20 0933 79     Resp 07/05/20 0933 20     Temp 07/05/20 0933 98.2 F (36.8 C)     Temp Source 07/05/20 0933 Oral     SpO2 07/05/20 0933 95 %     Weight --      Height --      Head Circumference --      Peak Flow --      Pain Score 07/05/20 0939 9     Pain Loc --      Pain Edu? --      Excl. in [place]? --    No data found.  Updated Vital Signs BP (!) 150/96 (BP Location: Left Arm)   Pulse 79   Temp 98.2 F (36.8 C) (Oral)   Resp 20   SpO2 95%   Physical Exam Constitutional:      General: He is not in acute distress.    Appearance: Normal appearance. He is well-developed. He is not toxic-appearing or diaphoretic.  HENT:     Head: Normocephalic and atraumatic.  Eyes:     Conjunctiva/sclera: Conjunctivae normal.     Pupils: Pupils are equal, round, and reactive to light.  Pulmonary:     Effort: Pulmonary effort is normal. No respiratory distress.  Musculoskeletal:     Cervical back: Normal range of motion and neck supple.     Comments: Mild swelling to right joint space. No erythema, warmth. No tenderness to palpation of the knee. No crepitus.  Full ROM. Strength 5/5. Sensation intact  Skin:    General: Skin is warm and dry.  Neurological:     Mental Status: He is alert  and oriented to person, place, and time.      UC Treatments / Results  Labs (all labs ordered are listed, but only abnormal results  are displayed) Labs Reviewed - No data to display  EKG   Radiology No results found.  Procedures Procedures (including critical care time)  Medications Ordered in UC Medications - No data to display  Initial Impression / Assessment and Plan / UC Course  I have reviewed the triage vital signs and the nursing notes.  Pertinent labs & imaging results that were available during my care of the patient were reviewed by me and considered in my medical decision making (see chart for details).    Atraumatic pain, ?athritis. NSAIDs, ice compress, knee sleeve during activity. Ortho follow up if symptoms not improving. Return precautions given.  Final Clinical Impressions(s) / UC Diagnoses   Final diagnoses:  Acute pain of right knee    ED Prescriptions    Medication Sig Dispense Auth. Provider   meloxicam (MOBIC) 7.5 MG tablet Take 1-2 tablets (7.5-15 mg total) by mouth daily. 20 tablet Ok Edwards, PA-C     PDMP not reviewed this encounter.   Ok Edwards, PA-C 07/05/20 1023

## 2020-07-23 ENCOUNTER — Other Ambulatory Visit: Payer: Self-pay

## 2020-07-23 DIAGNOSIS — E785 Hyperlipidemia, unspecified: Secondary | ICD-10-CM

## 2020-07-24 LAB — LIPID PANEL
Chol/HDL Ratio: 1.7 ratio (ref 0.0–5.0)
Cholesterol, Total: 120 mg/dL (ref 100–199)
HDL: 69 mg/dL (ref >39)
LDL Chol Calc (NIH): 30 mg/dL (ref 0–99)
Triglycerides: 118 mg/dL (ref 0–149)
VLDL Cholesterol Cal: 21 mg/dL (ref 5–40)

## 2020-08-04 ENCOUNTER — Other Ambulatory Visit: Payer: Self-pay

## 2020-08-04 MED ORDER — EZETIMIBE 10 MG PO TABS: 10.0000 mg | ORAL_TABLET | Freq: Every day | ORAL | 3 refills | Status: DC

## 2020-08-10 ENCOUNTER — Other Ambulatory Visit: Payer: Self-pay

## 2020-08-10 ENCOUNTER — Ambulatory Visit
Admission: RE | Admit: 2020-08-10 | Discharge: 2020-08-10 | Disposition: A | Discharge status: Home / Self Care | Payer: Medicare Other | Source: Ambulatory Visit | Attending: Family Medicine | Admitting: Family Medicine

## 2020-08-10 ENCOUNTER — Other Ambulatory Visit: Payer: Self-pay | Admitting: Family Medicine

## 2020-08-10 DIAGNOSIS — R1032 Left lower quadrant pain: Secondary | ICD-10-CM

## 2020-08-10 MED ORDER — IOPAMIDOL (ISOVUE-300) INJECTION 61%
100.0000 mL | Freq: Once | INTRAVENOUS | Status: AC | PRN
  Administered 2020-08-10: 100 mL via INTRAVENOUS

## 2020-08-26 ENCOUNTER — Other Ambulatory Visit: Payer: Self-pay | Admitting: Internal Medicine

## 2020-09-02 DIAGNOSIS — R109 Unspecified abdominal pain: Secondary | ICD-10-CM | POA: Diagnosis not present

## 2020-09-02 DIAGNOSIS — R140 Abdominal distension (gaseous): Secondary | ICD-10-CM | POA: Diagnosis not present

## 2020-09-02 DIAGNOSIS — K5900 Constipation, unspecified: Secondary | ICD-10-CM | POA: Diagnosis not present

## 2020-09-11 ENCOUNTER — Telehealth: Payer: Self-pay | Admitting: Cardiovascular Disease

## 2020-09-11 NOTE — Telephone Encounter (Signed)
BP not bad. Agree with evaluation.   Lake Bells T. Audie Box, MD, [place]  [address] [place], [place] [address] [phone]  12:28 PM

## 2020-09-11 NOTE — Telephone Encounter (Signed)
Spoke with pt who report experiencing chest pain for the past 3-4 days. He described it as a quick stabbing pain that generally last only a few seconds but 2-3 times per day. Pt denies any other symptoms during episode.   Pt also report MD wanted him to keep a log of recent BP (reading listed below).  Appointment scheduled for 09/14/20 for further evaluations. Pt verbalized understanding to report to ER if symptoms reoccur.   09/11/20 141/90  09/10/20 145/89 09/09/20 136/90

## 2020-09-11 NOTE — Telephone Encounter (Signed)
Pt c/o BP issue: STAT if pt c/o blurred vision, one-sided weakness or slurred speech  1. What are your last 5 BP readings?  09/11/20 141/90  09/10/20 145/89 09/09/20 136/90  2. Are you having any other symptoms (ex. Dizziness, headache, blurred vision, passed out)? No   3. What is your BP issue? Roberto George is calling stating Dr. Audie Box requested he keep track of BP readings  Pt c/o of Chest Pain: STAT if CP now or developed within 24 hours  1. Are you having CP right now? No   2. Are you experiencing any other symptoms (ex. SOB, nausea, vomiting, sweating)? No   3. How long have you been experiencing CP? Past 3 days   4. Is your CP continuous or coming and going? Coming and going   5. Have you taken Nitroglycerin? No   States it's a sharp pain that goes away quickly  ?

## 2020-09-14 ENCOUNTER — Ambulatory Visit: Payer: Medicare Other | Admitting: Cardiovascular Disease

## 2020-09-17 NOTE — Progress Notes (Signed)
Cardiology Office Note:   Date:  09/18/2020  NAME:  Roberto George    MRN: [id number] DOB:  April 01, 1951   PCP:  Donald Prose, MD  Cardiologist:  Evalina Field, MD   Referring MD: Donald Prose, MD   Chief Complaint  Patient presents with  . Chest Pain   History of Present Illness:   Roberto George is a 70 y.o. male with a hx of CAD, HTN, HLD who presents for follow-up. Has had sharp chest pain.  He reports last weekend he had an episode of sharp chest pain.  Described as sharp stabbing pain in the center of his chest.  Lasted seconds and resolved.  He reports he was sitting.  It was nonexertional.  Not relieved by rest or nitroglycerin.  He reports he has never taken any nitroglycerin.  He is not had any further episodes.  His EKG in office demonstrates normal sinus rhythm with left bundle branch block.  This is chronic for him.  No changes on my review.  Cardiovascular lamination normal.  He reports he is now building a house in [employer].  He reports this will be his last house before he retires.  He describes no limitations such as chest pain with activity.  He does get short of breath and is not exercising that much.  He has arthritis in his hips.  Blood pressure in office today 140/81.  He reports at home it has been 140-150.  This is too high.  We have taken him off amlodipine at one point.  We need to go back on this.  Problem List 1. CAD -PCI to proximal LAD 05/07/2019 -normal LCX/RCA -after positive stress test  2. LBBB 3. HTN 4. HLD -T chol 120, HDL 69, TG 118, LDL 30  Past Medical History: Past Medical History:  Diagnosis Date  . Allergic rhinitis   . Arthritis   . Complication of anesthesia    slow to wake up  . Diverticulosis of colon (without mention of hemorrhage) 06/10/2004   Dr Erskine Emery  . GERD (gastroesophageal reflux disease)   . Hemorrhoids   . History of kidney stones   . Hyperlipidemia   . Hypertension   . Multiple lipomas    back and scalp   . Presence of drug coated stent in LAD coronary artery 05/07/2019   Resolut Onyx DES (near ostial prox LAD crossing D1) 2.75 mm x 26 mm - 3.1 mm  . Rheumatic fever     Past Surgical History: Past Surgical History:  Procedure Laterality Date  . CORONARY STENT INTERVENTION N/A 05/07/2019   Procedure: CORONARY STENT INTERVENTION;  Surgeon: Leonie Man, MD;  Location: [facility];  Service: Cardiovascular;  Laterality: N/A;  . LEFT HEART CATH AND CORONARY ANGIOGRAPHY N/A 05/07/2019   Procedure: LEFT HEART CATH AND CORONARY ANGIOGRAPHY;  Surgeon: Leonie Man, MD;  Location: [facility];  Service: Cardiovascular;  Laterality: N/A;  . MASS EXCISION N/A 04/08/2014   Procedure: EXCISION OF POSTERIOR SCALP MASS AND EXC OF BACK MASS;  Surgeon: Edward Jolly, MD;  Location: [facility];  Service: General;  Laterality: N/A;  . NASAL SEPTUM SURGERY  >10 yrs ago  . TONSILLECTOMY AND ADENOIDECTOMY      Current Medications: Current Meds  Medication Sig  . amLODipine (NORVASC) 5 MG tablet Take 1 tablet (5 mg total) by mouth daily.  Marland Kitchen aspirin EC 81 MG tablet Take 81 mg by mouth daily.  Marland Kitchen atorvastatin (  LIPITOR) 40 MG tablet Take 40 mg by mouth daily.  . cholecalciferol (VITAMIN D) 1000 UNITS tablet Take 1,000 Units by mouth daily.   . Cyanocobalamin (VITAMIN B-12 PO) Take 1,000 mg by mouth.  . cyclobenzaprine (FLEXERIL) 10 MG tablet Take 10 mg by mouth at bedtime as needed for muscle spasms.   Marland Kitchen ezetimibe (ZETIA) 10 MG tablet TAKE 1 TABLET BY MOUTH EVERY DAY  . fluticasone (FLONASE) 50 MCG/ACT nasal spray Place 1 spray into both nostrils daily as needed for allergies or rhinitis.  . folic acid (FOLVITE) Q000111Q MCG tablet Take 800 mcg by mouth daily.   . meloxicam (MOBIC) 7.5 MG tablet Take 1-2 tablets (7.5-15 mg total) by mouth daily.  . nitroGLYCERIN (NITROSTAT) 0.4 MG SL tablet Place 1 tablet (0.4 mg total) under the tongue every 5 (five) minutes as needed.  Marland Kitchen REPATHA SURECLICK [id number]  MG/ML SOAJ SMARTSIG:140 Milligram(s) SUB-Q Every 2 Weeks  . sertraline (ZOLOFT) 50 MG tablet Take 50 mg by mouth at bedtime.      Allergies:    Erythromycin and Niacin and related   Social History: Social History   Socioeconomic History  . Marital status: Married    Spouse name: Not on file  . Number of children: 2  . Years of education: Not on file  . Highest education level: Not on file  Occupational History  . Occupation: retired  Tobacco Use  . Smoking status: Former Smoker    Years: 6.00    Quit date: 08/15/1970    Years since quitting: 50.1  . Smokeless tobacco: Never Used  Vaping Use  . Vaping Use: Never used  Substance and Sexual Activity  . Alcohol use: Yes    Comment: 12 beers on the weekend  . Drug use: No  . Sexual activity: Yes  Other Topics Concern  . Not on file  Social History Narrative  . Not on file   Social Determinants of Health   Financial Resource Strain: Not on file  Food Insecurity: Not on file  Transportation Needs: Not on file  Physical Activity: Not on file  Stress: Not on file  Social Connections: Not on file     Family History: The patient's family history includes Heart attack in his maternal grandmother and mother; Stroke in his father.  ROS:   All other ROS reviewed and negative. Pertinent positives noted in the HPI.     EKGs/Labs/Other Studies Reviewed:   The following studies were personally reviewed by me today:  EKG:  EKG is ordered today.  The ekg ordered today demonstrates  normal sinus rhythm heart rate 74, left bundle branch block noted, no change from prior, and was personally reviewed by me.   LHC 05/07/2019  SUMMARY  Single-vessel disease involving very proximal LAD with tandem 70% lesions before and after takeoff of 1st Diag with mild involvement of 1st Diag  Successful DES PCI covering both lesions in the LAD: Resolute Onyx DES 2.7 mm x 26 mm (3.1 mm)  Normal LVEF and EDP.  TTE 04/19/2019 1. The left  ventricle has normal systolic function, with an ejection  fraction of 55-60%. The cavity size was normal. Left ventricular diastolic  parameters were normal. No evidence of left ventricular regional wall  motion abnormalities.  2. The right ventricle has normal systolic function. The cavity was  normal. There is no increase in right ventricular wall thickness.  3. The aorta is normal unless otherwise noted.   Recent Labs: No results found for requested  labs within last 8760 hours.   Recent Lipid Panel    Component Value Date/Time   CHOL 120 07/23/2020 0853   TRIG 118 07/23/2020 0853   HDL 69 07/23/2020 0853   CHOLHDL 1.7 07/23/2020 0853   LDLCALC 30 07/23/2020 0853    Physical Exam:   VS:  BP 140/81   Pulse 74   Ht 5\' 11"  (1.803 m)   Wt 200 lb (90.7 kg)   SpO2 95%   BMI 27.89 kg/m    Wt Readings from Last 3 Encounters:  09/18/20 200 lb (90.7 kg)  05/04/20 195 lb 12.8 oz (88.8 kg)  04/22/20 196 lb (88.9 kg)    General: Well nourished, well developed, in no acute distress Head: Atraumatic, normal size  Eyes: PEERLA, EOMI  Neck: Supple, no JVD Endocrine: No thryomegaly Cardiac: Normal S1, S2; RRR; no murmurs, rubs, or gallops Lungs: Clear to auscultation bilaterally, no wheezing, rhonchi or rales  Abd: Soft, nontender, no hepatomegaly  Ext: No edema, pulses 2+ Musculoskeletal: No deformities, BUE and BLE strength normal and equal Skin: Warm and dry, no rashes   Neuro: Alert and oriented to person, place, time, and situation, CNII-XII grossly intact, no focal deficits  Psych: Normal mood and affect   ASSESSMENT:   CASIN SCHOLTES is a 70 y.o. male who presents for the following: 1. Chest pain, unspecified type   2. Coronary artery disease involving native coronary artery of native heart without angina pectoris   3. Hyperlipidemia LDL goal <70   4. Essential hypertension     PLAN:   1. Chest pain, unspecified type -Atypical chest pain.  Sharp.  Resolved.  EKG  unchanged.  No further episodes.  This is not exertional or relieved by nitroglycerin.  I think this is either acid reflux or gas.  This could also be related to blood pressure.  BPs have been high.  See discussion below.  I see no need for further testing at this time.  We will see how it goes.  I want him to get more active.  2. Coronary artery disease involving native coronary artery of native heart without angina pectoris -PCI to proximal LAD 05/07/2019 -normal LCX/RCA -after positive stress test  -Atypical sharp chest pain as above.  I think this is noncardiac.  No further work-up as detailed above. -We will continue aspirin 81 mg daily.  Better blood pressure control as detailed below.  3. Hyperlipidemia LDL goal <70 -LDL 30.  On Repatha.  Also on Lipitor and Zetia.  Doing well.  4. Essential hypertension -BP 140/81.  Has been running high at home.  Add Norvasc 5 mg daily.  Disposition: Return in about 6 months (around 03/18/2021).  Medication Adjustments/Labs and Tests Ordered: Current medicines are reviewed at length with the patient today.  Concerns regarding medicines are outlined above.  Orders Placed This Encounter  Procedures  . EKG 12-Lead   Meds ordered this encounter  Medications  . amLODipine (NORVASC) 5 MG tablet    Sig: Take 1 tablet (5 mg total) by mouth daily.    Dispense:  180 tablet    Refill:  3    Patient Instructions  Medication Instructions:  Start Amlodipine 5 mg daily   *If you need a refill on your cardiac medications before your next appointment, please call your pharmacy*  Follow-Up: At [facility], you and your health needs are our priority.  As part of our continuing mission to provide you with exceptional heart care, we have  created designated Provider Care Teams.  These Care Teams include your primary Cardiologist (physician) and Advanced Practice Providers (APPs -  Physician Assistants and Nurse Practitioners) who all work together to provide  you with the care you need, when you need it.  We recommend signing up for the patient portal called "MyChart".  Sign up information is provided on this After Visit Summary.  MyChart is used to connect with patients for Virtual Visits (Telemedicine).  Patients are able to view lab/test results, encounter notes, upcoming appointments, etc.  Non-urgent messages can be sent to your provider as well.   To learn more about what you can do with MyChart, go to [email or web address].    Your next appointment:   6 month(s)  The format for your next appointment:   In Person  Provider:   Eleonore Chiquito, MD     Time Spent with Patient: I have spent a total of 25 minutes with patient reviewing hospital notes, telemetry, EKGs, labs and examining the patient as well as establishing an assessment and plan that was discussed with the patient.  > 50% of time was spent in direct patient care.  Signed, Addison Naegeli. Audie Box, University  [address], [place], [place] [address] [phone]  09/18/2020 9:18 AM

## 2020-09-18 ENCOUNTER — Ambulatory Visit: Payer: Medicare Other | Admitting: Cardiovascular Disease

## 2020-09-18 ENCOUNTER — Encounter: Payer: Self-pay | Admitting: Cardiovascular Disease

## 2020-09-18 ENCOUNTER — Other Ambulatory Visit: Payer: Self-pay

## 2020-09-18 VITALS — BP 140/81 | HR 74 | Ht 71.0 in | Wt 200.0 lb

## 2020-09-18 DIAGNOSIS — R079 Chest pain, unspecified: Secondary | ICD-10-CM | POA: Diagnosis not present

## 2020-09-18 DIAGNOSIS — E785 Hyperlipidemia, unspecified: Secondary | ICD-10-CM | POA: Diagnosis not present

## 2020-09-18 DIAGNOSIS — I10 Essential (primary) hypertension: Secondary | ICD-10-CM

## 2020-09-18 DIAGNOSIS — I2510 Atherosclerotic heart disease of native coronary artery without angina pectoris: Secondary | ICD-10-CM | POA: Diagnosis not present

## 2020-09-18 MED ORDER — AMLODIPINE BESYLATE 5 MG PO TABS: 5.0000 mg | ORAL_TABLET | Freq: Every day | ORAL | 3 refills | Status: DC

## 2020-09-18 NOTE — Patient Instructions (Signed)
Medication Instructions:  Start Amlodipine 5 mg daily   *If you need a refill on your cardiac medications before your next appointment, please call your pharmacy*  Follow-Up: At [facility], you and your health needs are our priority.  As part of our continuing mission to provide you with exceptional heart care, we have created designated Provider Care Teams.  These Care Teams include your primary Cardiologist (physician) and Advanced Practice Providers (APPs -  Physician Assistants and Nurse Practitioners) who all work together to provide you with the care you need, when you need it.  We recommend signing up for the patient portal called "MyChart".  Sign up information is provided on this After Visit Summary.  MyChart is used to connect with patients for Virtual Visits (Telemedicine).  Patients are able to view lab/test results, encounter notes, upcoming appointments, etc.  Non-urgent messages can be sent to your provider as well.   To learn more about what you can do with MyChart, go to [email or web address].    Your next appointment:   6 month(s)  The format for your next appointment:   In Person  Provider:   Eleonore Chiquito, MD

## 2020-10-22 ENCOUNTER — Telehealth: Payer: Self-pay

## 2020-10-22 DIAGNOSIS — G5600 Carpal tunnel syndrome, unspecified upper limb: Secondary | ICD-10-CM | POA: Diagnosis not present

## 2020-10-22 NOTE — Telephone Encounter (Signed)
   [facility] Pre-operative Risk Assessment    Request for surgical clearance:  1. What type of surgery is being performed? L1-2 L4-5 TRANSLAMINAR EPIDURAL STEROID INJECTION    2. When is this surgery scheduled? TBD   3. What type of clearance is required (medical clearance vs. Pharmacy clearance to hold med vs. Both)? BOTH  4. Are there any medications that need to be held prior to surgery and how long? PLAVIX  5. Practice name and name of physician performing surgery? [facility]  ATTN:  JESSICA    6. What is the office phone number? [phone]   7.   What is the office fax number? [phone]  8.   Anesthesia type (None, local, MAC, general) ? IV SEDATION

## 2020-10-22 NOTE — Telephone Encounter (Signed)
Roberto George 70 year old male is requesting cardiac clearance/recommendations for epidural spinal injection.  He was last seen in the clinic on 09/18/2020.  During that time he reported an episode of previous sharp chest pain that had since resolved.  His EKG showed normal sinus rhythm with unchanged left bundle branch block.  His blood pressure was elevated at 140/81 and he was placed back on amlodipine 5 mg daily.  He denied chest pain with activity.  His PMH includes coronary artery disease-PCI with proximal LAD 05/07/2019, normal LCx/RCA, LBBB, hypertension, and hyperlipidemia.  May his Plavix be held prior to his injection?  Thank you for your help.  Please direct your response to CV DIV preop for.  Jossie Ng. Roberto Preble NP-C    10/22/2020, 5:01 PM [facility] [place] [address] Office [phone] Fax [phone]

## 2020-10-23 NOTE — Telephone Encounter (Signed)
° °  Primary Cardiologist: Evalina Field, MD  Chart reviewed as part of pre-operative protocol coverage. Given past medical history and time since last visit, based on ACC/AHA guidelines, Roberto George would be at acceptable risk for the planned procedure without further cardiovascular testing.   His aspirin may be held for 5-7 days prior to his procedure.  Please resume as soon as hemostasis is achieved.  I will route this recommendation to the requesting party via Epic fax function and remove from pre-op pool.  Please call with questions.  Jossie Ng. Harlow Basley NP-C    10/23/2020, 10:08 AM [place] [address] Office [phone] Fax [phone]

## 2020-10-23 NOTE — Telephone Encounter (Signed)
He is not on Plavix.  He may proceed with surgery.  He is just on aspirin 81 mg a day.  If this needs to be held it can.  L[place] Bells T. Audie Box, MD, [place]  [address] [place], [place] [address] [phone]  9:31 AM

## 2020-11-16 DIAGNOSIS — G5601 Carpal tunnel syndrome, right upper limb: Secondary | ICD-10-CM | POA: Diagnosis not present

## 2020-11-23 DIAGNOSIS — Z85828 Personal history of other malignant neoplasm of skin: Secondary | ICD-10-CM | POA: Diagnosis not present

## 2020-11-23 DIAGNOSIS — L821 Other seborrheic keratosis: Secondary | ICD-10-CM | POA: Diagnosis not present

## 2020-11-23 DIAGNOSIS — B078 Other viral warts: Secondary | ICD-10-CM | POA: Diagnosis not present

## 2020-11-23 DIAGNOSIS — L814 Other melanin hyperpigmentation: Secondary | ICD-10-CM | POA: Diagnosis not present

## 2020-11-23 DIAGNOSIS — D1801 Hemangioma of skin and subcutaneous tissue: Secondary | ICD-10-CM | POA: Diagnosis not present

## 2020-12-17 DIAGNOSIS — M25551 Pain in right hip: Secondary | ICD-10-CM | POA: Diagnosis not present

## 2020-12-17 DIAGNOSIS — M25561 Pain in right knee: Secondary | ICD-10-CM | POA: Diagnosis not present

## 2020-12-17 DIAGNOSIS — M25552 Pain in left hip: Secondary | ICD-10-CM | POA: Diagnosis not present

## 2020-12-22 DIAGNOSIS — M25561 Pain in right knee: Secondary | ICD-10-CM | POA: Diagnosis not present

## 2020-12-25 DIAGNOSIS — S83241A Other tear of medial meniscus, current injury, right knee, initial encounter: Secondary | ICD-10-CM | POA: Diagnosis not present

## 2020-12-31 NOTE — Telephone Encounter (Signed)
This was faxed  10/23/2020 10:10 AM [facility] Dr. Kristeen Miss attention Janett Billow by: Deberah Pelton, NP Will send again

## 2021-01-05 ENCOUNTER — Telehealth: Payer: Self-pay | Admitting: Cardiovascular Disease

## 2021-01-05 DIAGNOSIS — I2510 Atherosclerotic heart disease of native coronary artery without angina pectoris: Secondary | ICD-10-CM

## 2021-01-05 DIAGNOSIS — R0602 Shortness of breath: Secondary | ICD-10-CM

## 2021-01-05 NOTE — Telephone Encounter (Signed)
   [facility] Pre-operative Risk Assessment    Request for surgical clearance:  1. What type of surgery is being performed? Right Knee Scope  2. When is this surgery scheduled? 06/17/20225  3. What type of clearance is required (medical clearance vs. Pharmacy clearance to hold med vs. Both)? Both  4. Are there any medications that need to be held prior to surgery and how long? 39m Asprin  5. Practice name and name of physician performing surgery?  [facility]  6. What is your office phone number [phone]  7.   What is your office fax number [phone] 8.   Anesthesia type (None, local, MAC, general) ? Choice   LLarina Bras5/24/2022, 10:21 AM  _________________________________________________________________   (provider comments below)

## 2021-01-05 NOTE — Telephone Encounter (Signed)
   Pt is returning call, he gave his best phone# [phone]

## 2021-01-05 NOTE — Telephone Encounter (Signed)
   Name: Roberto George DOB: [birth year]  MRN: [id number]  Primary Cardiologist: Evalina Field, MD  Chart reviewed as part of pre-operative protocol coverage.   70 y.o. male with . Coronary artery disease  . S/p DES to LAD in 04/2019 . Hypertension  . Hyperlipidemia  . LBBB  Last OV:  09/18/20 with Dr. Audie Box Procedure:  R knee scope Rx:  Hold ASA  RCRI:  Perioperative Risk of Major Cardiac Event is (%): 0.9 (low risk)  Left message for pt to call back.  Richardson Dopp, PA-C 01/05/2021, 11:10 AM

## 2021-01-05 NOTE — Telephone Encounter (Signed)
   Name: Roberto George DOB: August 14, 1951  MRN: [id number]  Primary Cardiologist: Evalina Field, MD  Chart reviewed as part of pre-operative protocol coverage.   70 y.o. male with  Coronary artery disease   S/p DES to LAD in 04/2019  Hypertension   Hyperlipidemia   LBBB  Last OV:  09/18/20 with Dr. Audie Box Procedure:  R knee scope Rx:  Hold ASA  RCRI:  Perioperative Risk of Major Cardiac Event is (%): 0.9 (low risk)  Patient was called today to review clinical status since last office visit.  The patient tells me he has developed exertional shortness of breath over the last few months.  He has also been limited by knee pain.  He notes that symptoms are somewhat reminiscent of what he had prior to his stent in 2020.  He has not had chest pain, syncope, orthopnea, leg edema.  He has not had bloody stools, cough, wheezing.  I explained to the patient that in order to provide adequate assessment for surgical risk, we should proceed with stress testing and follow-up in the office.  Shared Decision Making/Informed Consent The risks [chest pain, shortness of breath, cardiac arrhythmias, dizziness, blood pressure fluctuations, myocardial infarction, stroke/transient ischemic attack, nausea, vomiting, allergic reaction, radiation exposure, metallic taste sensation and life-threatening complications (estimated to be 1 in 10,000)], benefits (risk stratification, diagnosing coronary artery disease, treatment guidance) and alternatives of a nuclear stress test were discussed in detail with Roberto George and he agrees to proceed.   Callback staff 1.  Schedule Lexiscan Myoview-order placed in epic 2.  Arrange follow-up with Dr. Audie Box or APP after Myoview to review results, assess symptoms and make final surgical clearance recommendations. 3.  Please notify requesting surgeon that the patient will be set up for stress testing and follow-up office visit. 4.  Surgery scheduled for 6/17.  Please  see if testing and follow-up can be arranged prior to this date.  Richardson Dopp, PA-C  01/05/2021, 3:21 PM

## 2021-01-05 NOTE — Telephone Encounter (Signed)
Will send to scheduling to schedule Myoview and a f/u with Dr. Audie Box for pre op. Pt's surgery is 01/29/21. Pre op provider today would like to see if testing and provider appt can be done before the surgery date of 01/29/21.

## 2021-01-06 NOTE — Telephone Encounter (Signed)
Per Dr. Audie Box: He does not need to be seen. He can get his stress and then cleared

## 2021-01-07 ENCOUNTER — Telehealth (HOSPITAL_COMMUNITY): Payer: Self-pay | Admitting: *Deleted

## 2021-01-07 NOTE — Telephone Encounter (Signed)
Close encounter 

## 2021-01-08 ENCOUNTER — Other Ambulatory Visit: Payer: Self-pay

## 2021-01-08 ENCOUNTER — Ambulatory Visit (HOSPITAL_COMMUNITY)
Admission: RE | Admit: 2021-01-08 | Discharge: 2021-01-08 | Disposition: A | Discharge status: Home / Self Care | Payer: Medicare Other | Source: Ambulatory Visit | Attending: Cardiovascular Disease | Admitting: Cardiovascular Disease

## 2021-01-08 DIAGNOSIS — R0602 Shortness of breath: Secondary | ICD-10-CM | POA: Diagnosis not present

## 2021-01-08 DIAGNOSIS — I2510 Atherosclerotic heart disease of native coronary artery without angina pectoris: Secondary | ICD-10-CM | POA: Diagnosis not present

## 2021-01-08 LAB — MYOCARDIAL PERFUSION IMAGING
LV dias vol: 131 mL (ref 62–150)
LV sys vol: 68 mL
Peak HR: 94 {beats}/min
Rest HR: 67 {beats}/min
SDS: 0
SRS: 5
SSS: 5
TID: 1.04

## 2021-01-08 MED ORDER — REGADENOSON 0.4 MG/5ML IV SOLN
0.4000 mg | Freq: Once | INTRAVENOUS | Status: AC
  Administered 2021-01-08: 0.4 mg via INTRAVENOUS

## 2021-01-08 MED ORDER — TECHNETIUM TC 99M TETROFOSMIN IV KIT
9.7000 | PACK | Freq: Once | INTRAVENOUS | Status: AC | PRN
  Administered 2021-01-08: 9.7 via INTRAVENOUS
  Filled 2021-01-08: qty 10

## 2021-01-08 MED ORDER — TECHNETIUM TC 99M TETROFOSMIN IV KIT
32.6000 | PACK | Freq: Once | INTRAVENOUS | Status: AC | PRN
  Administered 2021-01-08: 32.6 via INTRAVENOUS
  Filled 2021-01-08: qty 33

## 2021-01-11 ENCOUNTER — Encounter: Payer: Self-pay | Admitting: Physician Assistant

## 2021-01-11 NOTE — Progress Notes (Signed)
Cardiology Office Note:   Date:  01/13/2021  NAME:  Roberto George    MRN: [id number] DOB:  February 07, 1951   PCP:  Donald Prose, MD  Cardiologist:  Evalina Field, MD  Electrophysiologist:  None   Referring MD: Frederik Pear, MD   Chief Complaint  Patient presents with  . Shortness of Breath   History of Present Illness:   Roberto George is a 70 y.o. male with a hx of CAD s/p PCI, HTN, HLD who presents for follow-up.  He reports he is doing fairly well.  He reports he has had shortness of breath for the last 2 to 3 months.  He reports when he exerts himself he gets winded.  He has no chest pain or chest discomfort.  His blood pressure is 124/70.  Heart rate 76.  He has a chronic left bundle branch block.  He had a stress test last week.  This was normal.  I did review the EKGs.  He has a left bundle branch block that is chronic.  He denies any lower extremity edema.  Denies orthopnea or PND.  He has controlled his cholesterol on Repatha.  All of his other comorbidities appear to be stable.  He reports that he will have right knee surgery with Dr. Frederik Pear.  He will have meniscus work as well as some other procedures of the right knee.  He reports he has not been active at all due to his right knee problems.  He is not exercising much at all.  He was able to climb a flight of stairs.  He is able to walk up to 1 mile despite this limitation.   Problem List 1. CAD -PCI to proximal LAD 05/07/2019 -normal LCX/RCA -after positive stress test  2. LBBB 3. HTN 4. HLD -T chol120, HDL 69, TG 118, LDL 30  Past Medical History: Past Medical History:  Diagnosis Date  . Allergic rhinitis   . Arthritis   . CAD (coronary artery disease)    s/p DES to LAD in 04/2019 // Myoview 5/22: EF 48, no ischemia   . Complication of anesthesia    slow to wake up  . Diverticulosis of colon (without mention of hemorrhage) 06/10/2004   Dr Erskine Emery  . GERD (gastroesophageal reflux disease)   .  Hemorrhoids   . History of kidney stones   . Hyperlipidemia   . Hypertension   . Multiple lipomas    back and scalp  . Presence of drug coated stent in LAD coronary artery 05/07/2019   Resolut Onyx DES (near ostial prox LAD crossing D1) 2.75 mm x 26 mm - 3.1 mm  . Rheumatic fever     Past Surgical History: Past Surgical History:  Procedure Laterality Date  . CORONARY STENT INTERVENTION N/A 05/07/2019   Procedure: CORONARY STENT INTERVENTION;  Surgeon: Leonie Man, MD;  Location: [facility];  Service: Cardiovascular;  Laterality: N/A;  . LEFT HEART CATH AND CORONARY ANGIOGRAPHY N/A 05/07/2019   Procedure: LEFT HEART CATH AND CORONARY ANGIOGRAPHY;  Surgeon: Leonie Man, MD;  Location: [facility];  Service: Cardiovascular;  Laterality: N/A;  . MASS EXCISION N/A 04/08/2014   Procedure: EXCISION OF POSTERIOR SCALP MASS AND EXC OF BACK MASS;  Surgeon: Edward Jolly, MD;  Location: [facility];  Service: General;  Laterality: N/A;  . NASAL SEPTUM SURGERY  >10 yrs ago  . TONSILLECTOMY AND ADENOIDECTOMY      Current Medications: Current Meds  Medication Sig  . aspirin EC 81 MG tablet Take 81 mg by mouth daily.  Marland Kitchen atorvastatin (LIPITOR) 40 MG tablet Take 40 mg by mouth daily.  . cholecalciferol (VITAMIN D) 1000 UNITS tablet Take 1,000 Units by mouth daily.   . Cyanocobalamin (VITAMIN B-12 PO) Take 1,000 mg by mouth.  . cyclobenzaprine (FLEXERIL) 10 MG tablet Take 10 mg by mouth at bedtime as needed for muscle spasms.   Marland Kitchen ezetimibe (ZETIA) 10 MG tablet TAKE 1 TABLET BY MOUTH EVERY DAY  . fluticasone (FLONASE) 50 MCG/ACT nasal spray Place 1 spray into both nostrils daily as needed for allergies or rhinitis.  . folic acid (FOLVITE) 977 MCG tablet Take 800 mcg by mouth daily.   Marland Kitchen lisinopril (ZESTRIL) 20 MG tablet Take 1 tablet (20 mg total) by mouth daily.  . nitroGLYCERIN (NITROSTAT) 0.4 MG SL tablet Place 1 tablet (0.4 mg total) under the tongue every 5 (five) minutes  as needed.  Marland Kitchen REPATHA SURECLICK 414 MG/ML SOAJ ELTRVUYE:334 Milligram(s) SUB-Q Every 2 Weeks  . sertraline (ZOLOFT) 50 MG tablet Take 50 mg by mouth at bedtime.   . Zinc 50 MG CAPS 1 capsule     Allergies:    Erythromycin and Niacin and related   Social History: Social History   Socioeconomic History  . Marital status: Married    Spouse name: Not on file  . Number of children: 2  . Years of education: Not on file  . Highest education level: Not on file  Occupational History  . Occupation: retired  Tobacco Use  . Smoking status: Former Smoker    Years: 6.00    Quit date: 08/15/1970    Years since quitting: 50.4  . Smokeless tobacco: Never Used  Vaping Use  . Vaping Use: Never used  Substance and Sexual Activity  . Alcohol use: Yes    Comment: 12 beers on the weekend  . Drug use: No  . Sexual activity: Yes  Other Topics Concern  . Not on file  Social History Narrative  . Not on file   Social Determinants of Health   Financial Resource Strain: Not on file  Food Insecurity: Not on file  Transportation Needs: Not on file  Physical Activity: Not on file  Stress: Not on file  Social Connections: Not on file     Family History: The patient's family history includes Heart attack in his maternal grandmother and mother; Stroke in his father.  ROS:   All other ROS reviewed and negative. Pertinent positives noted in the HPI.     EKGs/Labs/Other Studies Reviewed:   The following studies were personally reviewed by me today:  NM Stress 01/08/2021   Nuclear stress EF: 48%. Septal wall hypokinesis  The left ventricular ejection fraction is mildly decreased (45-54%).  There was no ST segment deviation noted during stress.  Decreased radiotracer uptake in all segments at rest and stress when compared to high uptake in lateral wall. There is not appear to be any significant ischemic reversibility however.  Intermediate risk test. No ischemia identified. No significant  change when compared to prior study.    Recent Labs: No results found for requested labs within last 8760 hours.   Recent Lipid Panel    Component Value Date/Time   CHOL 120 07/23/2020 0853   TRIG 118 07/23/2020 0853   HDL 69 07/23/2020 0853   CHOLHDL 1.7 07/23/2020 0853   LDLCALC 30 07/23/2020 0853    Physical Exam:   VS:  BP 124/70 (  BP Location: Left Arm, Patient Position: Sitting, Cuff Size: Normal)   Pulse 76   Ht 5' 10.5" (1.791 m)   Wt 200 lb 3.2 oz (90.8 kg)   SpO2 96%   BMI 28.32 kg/m    Wt Readings from Last 3 Encounters:  01/13/21 200 lb 3.2 oz (90.8 kg)  01/08/21 200 lb (90.7 kg)  09/18/20 200 lb (90.7 kg)    General: Well nourished, well developed, in no acute distress Head: Atraumatic, normal size  Eyes: PEERLA, EOMI  Neck: Supple, no JVD Endocrine: No thryomegaly Cardiac: Normal S1, S2; RRR; no murmurs, rubs, or gallops Lungs: Clear to auscultation bilaterally, no wheezing, rhonchi or rales  Abd: Soft, nontender, no hepatomegaly  Ext: No edema, pulses 2+ Musculoskeletal: No deformities, BUE and BLE strength normal and equal Skin: Warm and dry, no rashes   Neuro: Alert and oriented to person, place, time, and situation, CNII-XII grossly intact, no focal deficits  Psych: Normal mood and affect   ASSESSMENT:   ABANOUB HANKEN is a 70 y.o. male who presents for the following: 1. Preoperative cardiovascular examination   2. SOB (shortness of breath) on exertion   3. Coronary artery disease involving native coronary artery of native heart without angina pectoris   4. Hyperlipidemia LDL goal <70   5. Essential hypertension     PLAN:   1. Preoperative cardiovascular examination 2. SOB (shortness of breath) on exertion -He reports 1 to 2 months of shortness of breath.  No evidence of volume overload.  Cardiovascular examination is normal.  He had a stress test last week that was normal.  No evidence of ischemia. -I would like to repeat an echocardiogram.   We will get this done next week.  I have a low suspicion for any cardiac pathology.  I suspect he is just deconditioned.  As long as his echo is normal we will let him go to surgery. -Despite his shortness of breath he cannot complete a flight of stairs.  He has no chest pain or significant shortness of breath with this.  I just want to make sure his echocardiogram is stable from prior.  3. Coronary artery disease involving native coronary artery of native heart without angina pectoris 4. Hyperlipidemia LDL goal <70 -Status post PCI to the proximal LAD in 2020. -On aspirin.  He has completed 1 year of DAPT. -He is on Repatha.  LDL is at goal.  5. Essential hypertension -Well-controlled.  Disposition: Return in about 6 months (around 07/15/2021).  Medication Adjustments/Labs and Tests Ordered: Current medicines are reviewed at length with the patient today.  Concerns regarding medicines are outlined above.  Orders Placed This Encounter  Procedures  . ECHOCARDIOGRAM COMPLETE   No orders of the defined types were placed in this encounter.   Patient Instructions  Medication Instructions:  The current medical regimen is effective;  continue present plan and medications.  *If you need a refill on your cardiac medications before your next appointment, please call your pharmacy*   Testing/Procedures:  Echocardiogram - Your physician has requested that you have an echocardiogram. Echocardiography is a painless test that uses sound waves to create images of your heart. It provides your doctor with information about the size and shape of your heart and how well your heart's chambers and valves are working. This procedure takes approximately one hour. There are no restrictions for this procedure. This will be performed at our [facility] location - [address], [address].  Follow-Up: At [facility], you and your health needs are our priority.  As part of our continuing mission to  provide you with exceptional heart care, we have created designated Provider Care Teams.  These Care Teams include your primary Cardiologist (physician) and Advanced Practice Providers (APPs -  Physician Assistants and Nurse Practitioners) who all work together to provide you with the care you need, when you need it.  We recommend signing up for the patient portal called "MyChart".  Sign up information is provided on this After Visit Summary.  MyChart is used to connect with patients for Virtual Visits (Telemedicine).  Patients are able to view lab/test results, encounter notes, upcoming appointments, etc.  Non-urgent messages can be sent to your provider as well.   To learn more about what you can do with MyChart, go to [email or web address].    Your next appointment:   6 month(s)  The format for your next appointment:   In Person  Provider:   Eleonore Chiquito, MD       Time Spent with Patient: I have spent a total of 35 minutes with patient reviewing hospital notes, telemetry, EKGs, labs and examining the patient as well as establishing an assessment and plan that was discussed with the patient.  > 50% of time was spent in direct patient care.  Signed, Addison Naegeli. Audie Box, MD, [place]  [address] [place], [place] [address] [phone]  01/13/2021 2:11 PM

## 2021-01-11 NOTE — Telephone Encounter (Signed)
   Name: Roberto George DOB: 18-Jan-1951  MRN: [id number]  Primary Cardiologist: Evalina Field, MD  [place] 01/08/2021: No ischemia, EF 48. Study reviewed with Dr. Audie Box.  Pt is ok to proceed with surgery.  Pt has f/u with Dr. Audie Box 01/13/21 as well.    Recommendations: . Based on ACC/AHA guidelines, the patient is at acceptable risk for the planned procedure and may proceed without further cardiovascular testing.  . Ideally, pt should remain on ASA without interruption.  If the bleeding risk is too great, it can be held up to 7 days prior and resumed as soon as it is safe post op.  Please call with questions. Richardson Dopp, PA-C 01/11/2021, 8:36 AM

## 2021-01-12 ENCOUNTER — Telehealth: Payer: Self-pay | Admitting: *Deleted

## 2021-01-12 NOTE — Telephone Encounter (Signed)
-----   Message from Liliane Shi, [place] sent at 01/11/2021  8:40 AM EDT ----- Reviewed stress test with Dr. Audie Box.  There is no ischemia.  Pt can proceed with his knee surgery. PLAN:  -Ok to proceed with knee surgery -Keep f/u with Dr. Audie Box 6/1 to review symptoms and stress test further -Send copy to PCP -I will fax notes to surgeon Richardson Dopp, PA-C    01/11/2021 8:32 AM

## 2021-01-12 NOTE — Telephone Encounter (Signed)
Spoke with patient about results of echo and the approval to go ahead with knee surgery.  Reminded to keep f/u appointment with Dr. Audie Box.  Patient verbalizes understanding.

## 2021-01-13 ENCOUNTER — Encounter: Payer: Self-pay | Admitting: Cardiovascular Disease

## 2021-01-13 ENCOUNTER — Ambulatory Visit: Payer: Medicare Other | Admitting: Cardiovascular Disease

## 2021-01-13 ENCOUNTER — Other Ambulatory Visit: Payer: Self-pay

## 2021-01-13 VITALS — BP 124/70 | HR 76 | Ht 70.5 in | Wt 200.2 lb

## 2021-01-13 DIAGNOSIS — M542 Cervicalgia: Secondary | ICD-10-CM | POA: Diagnosis not present

## 2021-01-13 DIAGNOSIS — E782 Mixed hyperlipidemia: Secondary | ICD-10-CM | POA: Diagnosis not present

## 2021-01-13 DIAGNOSIS — I2510 Atherosclerotic heart disease of native coronary artery without angina pectoris: Secondary | ICD-10-CM | POA: Diagnosis not present

## 2021-01-13 DIAGNOSIS — R0602 Shortness of breath: Secondary | ICD-10-CM

## 2021-01-13 DIAGNOSIS — Z0000 Encounter for general adult medical examination without abnormal findings: Secondary | ICD-10-CM | POA: Diagnosis not present

## 2021-01-13 DIAGNOSIS — I10 Essential (primary) hypertension: Secondary | ICD-10-CM | POA: Diagnosis not present

## 2021-01-13 DIAGNOSIS — E785 Hyperlipidemia, unspecified: Secondary | ICD-10-CM | POA: Diagnosis not present

## 2021-01-13 DIAGNOSIS — Z01810 Encounter for preprocedural cardiovascular examination: Secondary | ICD-10-CM

## 2021-01-13 DIAGNOSIS — K219 Gastro-esophageal reflux disease without esophagitis: Secondary | ICD-10-CM | POA: Diagnosis not present

## 2021-01-13 DIAGNOSIS — Z1389 Encounter for screening for other disorder: Secondary | ICD-10-CM | POA: Diagnosis not present

## 2021-01-13 DIAGNOSIS — Z7289 Other problems related to lifestyle: Secondary | ICD-10-CM | POA: Diagnosis not present

## 2021-01-13 NOTE — Patient Instructions (Signed)
Medication Instructions:  The current medical regimen is effective;  continue present plan and medications.  *If you need a refill on your cardiac medications before your next appointment, please call your pharmacy*   Testing/Procedures:  Echocardiogram - Your physician has requested that you have an echocardiogram. Echocardiography is a painless test that uses sound waves to create images of your heart. It provides your doctor with information about the size and shape of your heart and how well your heart's chambers and valves are working. This procedure takes approximately one hour. There are no restrictions for this procedure. This will be performed at our [facility] location - [address], [address].    Follow-Up: At [facility], you and your health needs are our priority.  As part of our continuing mission to provide you with exceptional heart care, we have created designated Provider Care Teams.  These Care Teams include your primary Cardiologist (physician) and Advanced Practice Providers (APPs -  Physician Assistants and Nurse Practitioners) who all work together to provide you with the care you need, when you need it.  We recommend signing up for the patient portal called "MyChart".  Sign up information is provided on this After Visit Summary.  MyChart is used to connect with patients for Virtual Visits (Telemedicine).  Patients are able to view lab/test results, encounter notes, upcoming appointments, etc.  Non-urgent messages can be sent to your provider as well.   To learn more about what you can do with MyChart, go to [email or web address].    Your next appointment:   6 month(s)  The format for your next appointment:   In Person  Provider:   Eleonore Chiquito, MD

## 2021-01-15 ENCOUNTER — Ambulatory Visit (HOSPITAL_BASED_OUTPATIENT_CLINIC_OR_DEPARTMENT_OTHER)
Admission: RE | Admit: 2021-01-15 | Discharge: 2021-01-15 | Disposition: A | Discharge status: Home / Self Care | Payer: Medicare Other | Source: Ambulatory Visit | Attending: Cardiovascular Disease | Admitting: Cardiovascular Disease

## 2021-01-15 ENCOUNTER — Other Ambulatory Visit: Payer: Self-pay

## 2021-01-15 DIAGNOSIS — R0602 Shortness of breath: Secondary | ICD-10-CM | POA: Diagnosis not present

## 2021-01-15 LAB — ECHOCARDIOGRAM COMPLETE
Area-P 1/2: 2.26 cm2
S' Lateral: 3.2 cm

## 2021-01-15 NOTE — Progress Notes (Signed)
Everything looks good. He can proceed with surgery.   -W

## 2021-01-18 ENCOUNTER — Telehealth: Payer: Self-pay | Admitting: Cardiovascular Disease

## 2021-01-18 NOTE — Telephone Encounter (Signed)
Returned the call to the patient. He was calling for his echo results. Results given.  He would also like the results faxed to Dr. Frederik Pear at [facility]. Results have been faxed to [phone]

## 2021-01-18 NOTE — Telephone Encounter (Signed)
PT IS REACHING OUT TO HAVE RESULTS RELAYED VIA PHONE FOR PAST ECHO TEST, ALSO STATES THAT THIS INFO NEEDS TO BE RELAYED TO PCP FOR UPCOMING PROCEDURE.

## 2021-01-29 DIAGNOSIS — S83241A Other tear of medial meniscus, current injury, right knee, initial encounter: Secondary | ICD-10-CM | POA: Diagnosis not present

## 2021-01-29 DIAGNOSIS — G8918 Other acute postprocedural pain: Secondary | ICD-10-CM | POA: Diagnosis not present

## 2021-01-29 DIAGNOSIS — M94261 Chondromalacia, right knee: Secondary | ICD-10-CM | POA: Diagnosis not present

## 2021-01-29 DIAGNOSIS — S83231A Complex tear of medial meniscus, current injury, right knee, initial encounter: Secondary | ICD-10-CM | POA: Diagnosis not present

## 2021-02-04 DIAGNOSIS — R531 Weakness: Secondary | ICD-10-CM | POA: Diagnosis not present

## 2021-02-04 DIAGNOSIS — M25661 Stiffness of right knee, not elsewhere classified: Secondary | ICD-10-CM | POA: Diagnosis not present

## 2021-02-26 DIAGNOSIS — U071 COVID-19: Secondary | ICD-10-CM | POA: Diagnosis not present

## 2021-04-14 DIAGNOSIS — R0982 Postnasal drip: Secondary | ICD-10-CM | POA: Diagnosis not present

## 2021-04-20 DIAGNOSIS — M25561 Pain in right knee: Secondary | ICD-10-CM | POA: Diagnosis not present

## 2021-04-20 DIAGNOSIS — Z9889 Other specified postprocedural states: Secondary | ICD-10-CM | POA: Diagnosis not present

## 2021-05-12 DIAGNOSIS — M5481 Occipital neuralgia: Secondary | ICD-10-CM | POA: Diagnosis not present

## 2021-05-12 DIAGNOSIS — M47812 Spondylosis without myelopathy or radiculopathy, cervical region: Secondary | ICD-10-CM | POA: Diagnosis not present

## 2021-05-19 DIAGNOSIS — M47812 Spondylosis without myelopathy or radiculopathy, cervical region: Secondary | ICD-10-CM | POA: Diagnosis not present

## 2021-05-19 DIAGNOSIS — M4802 Spinal stenosis, cervical region: Secondary | ICD-10-CM | POA: Diagnosis not present

## 2021-05-19 DIAGNOSIS — M50223 Other cervical disc displacement at C6-C7 level: Secondary | ICD-10-CM | POA: Diagnosis not present

## 2021-05-20 DIAGNOSIS — M47812 Spondylosis without myelopathy or radiculopathy, cervical region: Secondary | ICD-10-CM | POA: Diagnosis not present

## 2021-06-03 DIAGNOSIS — M47812 Spondylosis without myelopathy or radiculopathy, cervical region: Secondary | ICD-10-CM | POA: Diagnosis not present

## 2021-06-15 DIAGNOSIS — E782 Mixed hyperlipidemia: Secondary | ICD-10-CM | POA: Diagnosis not present

## 2021-06-15 DIAGNOSIS — I25119 Atherosclerotic heart disease of native coronary artery with unspecified angina pectoris: Secondary | ICD-10-CM | POA: Diagnosis not present

## 2021-06-15 DIAGNOSIS — I10 Essential (primary) hypertension: Secondary | ICD-10-CM | POA: Diagnosis not present

## 2021-06-15 DIAGNOSIS — I700 Atherosclerosis of aorta: Secondary | ICD-10-CM | POA: Diagnosis not present

## 2021-06-29 ENCOUNTER — Other Ambulatory Visit: Payer: Self-pay | Admitting: Cardiovascular Disease

## 2021-07-13 DIAGNOSIS — J029 Acute pharyngitis, unspecified: Secondary | ICD-10-CM | POA: Diagnosis not present

## 2021-08-10 DIAGNOSIS — Z7289 Other problems related to lifestyle: Secondary | ICD-10-CM | POA: Diagnosis not present

## 2021-08-26 DIAGNOSIS — I10 Essential (primary) hypertension: Secondary | ICD-10-CM | POA: Diagnosis not present

## 2021-08-26 DIAGNOSIS — M47812 Spondylosis without myelopathy or radiculopathy, cervical region: Secondary | ICD-10-CM | POA: Diagnosis not present

## 2021-09-07 ENCOUNTER — Other Ambulatory Visit: Payer: Self-pay | Admitting: Internal Medicine

## 2021-09-28 ENCOUNTER — Other Ambulatory Visit: Payer: Self-pay | Admitting: Cardiovascular Disease

## 2021-10-19 ENCOUNTER — Other Ambulatory Visit: Payer: Self-pay | Admitting: Cardiovascular Disease

## 2021-10-26 DIAGNOSIS — M47812 Spondylosis without myelopathy or radiculopathy, cervical region: Secondary | ICD-10-CM | POA: Diagnosis not present

## 2021-10-28 DIAGNOSIS — Z85828 Personal history of other malignant neoplasm of skin: Secondary | ICD-10-CM | POA: Diagnosis not present

## 2021-10-28 DIAGNOSIS — L570 Actinic keratosis: Secondary | ICD-10-CM | POA: Diagnosis not present

## 2021-10-28 DIAGNOSIS — L308 Other specified dermatitis: Secondary | ICD-10-CM | POA: Diagnosis not present

## 2021-10-28 DIAGNOSIS — L821 Other seborrheic keratosis: Secondary | ICD-10-CM | POA: Diagnosis not present

## 2021-11-02 DIAGNOSIS — H0100A Unspecified blepharitis right eye, upper and lower eyelids: Secondary | ICD-10-CM | POA: Diagnosis not present

## 2021-11-02 DIAGNOSIS — H0100B Unspecified blepharitis left eye, upper and lower eyelids: Secondary | ICD-10-CM | POA: Diagnosis not present

## 2021-11-02 DIAGNOSIS — H43813 Vitreous degeneration, bilateral: Secondary | ICD-10-CM | POA: Diagnosis not present

## 2021-11-02 DIAGNOSIS — H35371 Puckering of macula, right eye: Secondary | ICD-10-CM | POA: Diagnosis not present

## 2021-11-12 ENCOUNTER — Other Ambulatory Visit: Payer: Self-pay | Admitting: Cardiovascular Disease

## 2021-12-07 ENCOUNTER — Other Ambulatory Visit: Payer: Self-pay | Admitting: Cardiovascular Disease

## 2021-12-07 DIAGNOSIS — M47812 Spondylosis without myelopathy or radiculopathy, cervical region: Secondary | ICD-10-CM | POA: Diagnosis not present

## 2021-12-23 DIAGNOSIS — L308 Other specified dermatitis: Secondary | ICD-10-CM | POA: Diagnosis not present

## 2021-12-23 DIAGNOSIS — D225 Melanocytic nevi of trunk: Secondary | ICD-10-CM | POA: Diagnosis not present

## 2021-12-23 DIAGNOSIS — D1801 Hemangioma of skin and subcutaneous tissue: Secondary | ICD-10-CM | POA: Diagnosis not present

## 2021-12-23 DIAGNOSIS — L821 Other seborrheic keratosis: Secondary | ICD-10-CM | POA: Diagnosis not present

## 2021-12-23 DIAGNOSIS — D2271 Melanocytic nevi of right lower limb, including hip: Secondary | ICD-10-CM | POA: Diagnosis not present

## 2021-12-23 DIAGNOSIS — Z85828 Personal history of other malignant neoplasm of skin: Secondary | ICD-10-CM | POA: Diagnosis not present

## 2021-12-23 DIAGNOSIS — L814 Other melanin hyperpigmentation: Secondary | ICD-10-CM | POA: Diagnosis not present

## 2021-12-31 ENCOUNTER — Other Ambulatory Visit: Payer: Self-pay | Admitting: Cardiovascular Disease

## 2022-01-06 DIAGNOSIS — M47812 Spondylosis without myelopathy or radiculopathy, cervical region: Secondary | ICD-10-CM | POA: Diagnosis not present

## 2022-01-18 ENCOUNTER — Other Ambulatory Visit: Payer: Self-pay | Admitting: Cardiovascular Disease

## 2022-01-26 ENCOUNTER — Other Ambulatory Visit: Payer: Self-pay | Admitting: Cardiovascular Disease

## 2022-01-28 DIAGNOSIS — I2510 Atherosclerotic heart disease of native coronary artery without angina pectoris: Secondary | ICD-10-CM | POA: Diagnosis not present

## 2022-01-28 DIAGNOSIS — I700 Atherosclerosis of aorta: Secondary | ICD-10-CM | POA: Diagnosis not present

## 2022-01-28 DIAGNOSIS — K219 Gastro-esophageal reflux disease without esophagitis: Secondary | ICD-10-CM | POA: Diagnosis not present

## 2022-01-28 DIAGNOSIS — I10 Essential (primary) hypertension: Secondary | ICD-10-CM | POA: Diagnosis not present

## 2022-01-28 DIAGNOSIS — Z0000 Encounter for general adult medical examination without abnormal findings: Secondary | ICD-10-CM | POA: Diagnosis not present

## 2022-01-28 DIAGNOSIS — Z7289 Other problems related to lifestyle: Secondary | ICD-10-CM | POA: Diagnosis not present

## 2022-01-28 DIAGNOSIS — E782 Mixed hyperlipidemia: Secondary | ICD-10-CM | POA: Diagnosis not present

## 2022-02-08 ENCOUNTER — Ambulatory Visit: Payer: Medicare Other | Admitting: Cardiovascular Disease

## 2022-02-08 DIAGNOSIS — M47812 Spondylosis without myelopathy or radiculopathy, cervical region: Secondary | ICD-10-CM | POA: Diagnosis not present

## 2022-02-10 NOTE — Progress Notes (Signed)
Cardiology Office Note:   Date:  02/11/2022  NAME:  Roberto George    MRN: [id number] DOB:  [birth year]   PCP:  Donald Prose, MD  Cardiologist:  Evalina Field, MD  Electrophysiologist:  None   Referring MD: Donald Prose, MD   Chief Complaint  Patient presents with   Follow-up        History of Present Illness:   Roberto George is a 71 y.o. male with a hx of CAD, hyperlipidemia, left bundle branch block presents for follow-up.  Reports he is doing well.  No chest pain or trouble breathing.  Walking up to 1 mile per day.  Does this in segments.  Cannot walk 1 mile straight due to arthritis.  LDL cholesterol at goal.  Taking Repatha once per month.  Cannot afford to take it twice per month.  Overall doing quite well.  Denies any chest pain symptoms.  Blood pressure well controlled.  Working on his diet.  Problem List 1. CAD -PCI to proximal LAD 05/07/2019 -normal LCX/RCA -after positive stress test  2. LBBB 3. HTN 4. HLD -T chol 136, HDL 55, LDL 64, triglycerides 88  Past Medical History: Past Medical History:  Diagnosis Date   Allergic rhinitis    Arthritis    CAD (coronary artery disease)    s/p DES to LAD in 04/2019 // Myoview 5/22: EF 48, no ischemia    Complication of anesthesia    slow to wake up   Diverticulosis of colon (without mention of hemorrhage) 06/10/2004   Dr Erskine Emery   GERD (gastroesophageal reflux disease)    Hemorrhoids    History of kidney stones    Hyperlipidemia    Hypertension    Multiple lipomas    back and scalp   Presence of drug coated stent in LAD coronary artery 05/07/2019   Resolut Onyx DES (near ostial prox LAD crossing D1) 2.75 mm x 26 mm - 3.1 mm   Rheumatic fever     Past Surgical History: Past Surgical History:  Procedure Laterality Date   CORONARY STENT INTERVENTION N/A 05/07/2019   Procedure: CORONARY STENT INTERVENTION;  Surgeon: Leonie Man, MD;  Location: [facility];  Service: Cardiovascular;  Laterality:  N/A;   LEFT HEART CATH AND CORONARY ANGIOGRAPHY N/A 05/07/2019   Procedure: LEFT HEART CATH AND CORONARY ANGIOGRAPHY;  Surgeon: Leonie Man, MD;  Location: [facility];  Service: Cardiovascular;  Laterality: N/A;   MASS EXCISION N/A 04/08/2014   Procedure: EXCISION OF POSTERIOR SCALP MASS AND EXC OF BACK MASS;  Surgeon: Edward Jolly, MD;  Location: [facility];  Service: General;  Laterality: N/A;   NASAL SEPTUM SURGERY  >10 yrs ago   TONSILLECTOMY AND ADENOIDECTOMY      Current Medications: Current Meds  Medication Sig   amLODipine (NORVASC) 5 MG tablet Take 1 tablet (5 mg total) by mouth daily. KEEP OV.   aspirin EC 81 MG tablet Take 81 mg by mouth daily.   atorvastatin (LIPITOR) 40 MG tablet Take 40 mg by mouth daily.   cholecalciferol (VITAMIN D) 1000 UNITS tablet Take 1,000 Units by mouth daily.    cyclobenzaprine (FLEXERIL) 10 MG tablet Take 10 mg by mouth at bedtime as needed for muscle spasms.    Evolocumab (REPATHA SURECLICK) 245 MG/ML SOAJ INJECT 140 MG INTO THE SKIN EVERY 14 (FOURTEEN) DAYS. (Patient taking differently: Once a month)   ezetimibe (ZETIA) 10 MG tablet TAKE 1 TABLET BY MOUTH  DAILY   fluticasone (FLONASE) 50 MCG/ACT nasal spray Place 1 spray into both nostrils daily as needed for allergies or rhinitis.   folic acid (FOLVITE) 295 MCG tablet Take 800 mcg by mouth daily.    lisinopril (ZESTRIL) 20 MG tablet Take 1 tablet (20 mg total) by mouth daily.   nitroGLYCERIN (NITROSTAT) 0.4 MG SL tablet Place 1 tablet (0.4 mg total) under the tongue every 5 (five) minutes as needed.   sertraline (ZOLOFT) 50 MG tablet Take 100 mg by mouth at bedtime.     Allergies:    Erythromycin and Niacin and related   Social History: Social History   Socioeconomic History   Marital status: Married    Spouse name: Not on file   Number of children: 2   Years of education: Not on file   Highest education level: Not on file  Occupational History   Occupation: retired   Tobacco Use   Smoking status: Former    Years: 6.00    Types: Cigarettes    Quit date: 08/15/1970    Years since quitting: 51.5   Smokeless tobacco: Never  Vaping Use   Vaping Use: Never used  Substance and Sexual Activity   Alcohol use: Yes    Comment: 12 beers on the weekend   Drug use: No   Sexual activity: Yes  Other Topics Concern   Not on file  Social History Narrative   Not on file   Social Determinants of Health   Financial Resource Strain: Not on file  Food Insecurity: Not on file  Transportation Needs: Not on file  Physical Activity: Not on file  Stress: Not on file  Social Connections: Not on file     Family History: The patient's family history includes Heart attack in his maternal grandmother and mother; Stroke in his father.  ROS:   All other ROS reviewed and negative. Pertinent positives noted in the HPI.     EKGs/Labs/Other Studies Reviewed:   The following studies were personally reviewed by me today:   [place] 05/07/2019 SUMMARY Single-vessel disease involving very proximal LAD with tandem 70% lesions before and after takeoff of 1st Diag with mild involvement of 1st Diag Successful DES PCI covering both lesions in the LAD: Resolute Onyx DES 2.7 mm x 26 mm (3.1 mm) Normal LVEF and EDP.  Recent Labs: No results found for requested labs within last 365 days.   Recent Lipid Panel    Component Value Date/Time   CHOL 120 07/23/2020 0853   TRIG 118 07/23/2020 0853   HDL 69 07/23/2020 0853   CHOLHDL 1.7 07/23/2020 0853   LDLCALC 30 07/23/2020 0853    Physical Exam:   VS:  BP 124/80   Pulse 67   Ht '5\' 11"'$  (1.803 m)   Wt 198 lb 9.6 oz (90.1 kg)   SpO2 95%   BMI 27.70 kg/m    Wt Readings from Last 3 Encounters:  02/11/22 198 lb 9.6 oz (90.1 kg)  01/13/21 200 lb 3.2 oz (90.8 kg)  01/08/21 200 lb (90.7 kg)    General: Well nourished, well developed, in no acute distress Head: Atraumatic, normal size  Eyes: PEERLA, EOMI  Neck: Supple, no  JVD Endocrine: No thryomegaly Cardiac: Normal S1, S2; RRR; no murmurs, rubs, or gallops Lungs: Clear to auscultation bilaterally, no wheezing, rhonchi or rales  Abd: Soft, nontender, no hepatomegaly  Ext: No edema, pulses 2+ Musculoskeletal: No deformities, BUE and BLE strength normal and equal Skin: Warm and dry, no  rashes   Neuro: Alert and oriented to person, place, time, and situation, CNII-XII grossly intact, no focal deficits  Psych: Normal mood and affect   ASSESSMENT:   Roberto George is a 71 y.o. male who presents for the following: 1. Coronary artery disease involving native coronary artery of native heart without angina pectoris   2. Hyperlipidemia LDL goal <70   3. Essential hypertension     PLAN:   1. Coronary artery disease involving native coronary artery of native heart without angina pectoris 2. Hyperlipidemia LDL goal <70 3. Essential hypertension -PCI to the proximal LAD in September 2020.  All of his risk factors are well controlled.  No symptoms of angina.  Recommended regular exercise as well as proper diet.  Seems to be doing well.  On Repatha, Lipitor and Zetia.  Most recent LDL cholesterol at goal.  Blood pressure is at goal.  He will see me yearly.  Disposition: Return in about 1 year (around 02/12/2023).  Medication Adjustments/Labs and Tests Ordered: Current medicines are reviewed at length with the patient today.  Concerns regarding medicines are outlined above.  No orders of the defined types were placed in this encounter.  No orders of the defined types were placed in this encounter.   Patient Instructions  Medication Instructions:  Your Physician recommend you continue on your current medication as directed.    *If you need a refill on your cardiac medications before your next appointment, please call your pharmacy*   Lab Work: None ordered today   Testing/Procedures: None ordered today   Follow-Up: At [facility], you and your  health needs are our priority.  As part of our continuing mission to provide you with exceptional heart care, we have created designated Provider Care Teams.  These Care Teams include your primary Cardiologist (physician) and Advanced Practice Providers (APPs -  Physician Assistants and Nurse Practitioners) who all work together to provide you with the care you need, when you need it.  We recommend signing up for the patient portal called "MyChart".  Sign up information is provided on this After Visit Summary.  MyChart is used to connect with patients for Virtual Visits (Telemedicine).  Patients are able to view lab/test results, encounter notes, upcoming appointments, etc.  Non-urgent messages can be sent to your provider as well.   To learn more about what you can do with MyChart, go to [email or web address].    Your next appointment:   1 year(s)  The format for your next appointment:   In Person  Provider:   Evalina Field, MD {           Time Spent with Patient: I have spent a total of 25 minutes with patient reviewing hospital notes, telemetry, EKGs, labs and examining the patient as well as establishing an assessment and plan that was discussed with the patient.  > 50% of time was spent in direct patient care.  Signed, Addison Naegeli. Audie Box, MD, [place]  [address] [place], [place] [address] [phone]  02/11/2022 9:27 AM

## 2022-02-11 ENCOUNTER — Ambulatory Visit: Payer: Medicare Other | Admitting: Cardiovascular Disease

## 2022-02-11 ENCOUNTER — Encounter: Payer: Self-pay | Admitting: Cardiovascular Disease

## 2022-02-11 VITALS — BP 124/80 | HR 67 | Ht 71.0 in | Wt 198.6 lb

## 2022-02-11 DIAGNOSIS — E785 Hyperlipidemia, unspecified: Secondary | ICD-10-CM | POA: Diagnosis not present

## 2022-02-11 DIAGNOSIS — I10 Essential (primary) hypertension: Secondary | ICD-10-CM | POA: Diagnosis not present

## 2022-02-11 DIAGNOSIS — I2510 Atherosclerotic heart disease of native coronary artery without angina pectoris: Secondary | ICD-10-CM | POA: Diagnosis not present

## 2022-02-11 NOTE — Patient Instructions (Signed)
Medication Instructions:  Your Physician recommend you continue on your current medication as directed.    *If you need a refill on your cardiac medications before your next appointment, please call your pharmacy*   Lab Work: None ordered today   Testing/Procedures: None ordered today   Follow-Up: At [facility], you and your health needs are our priority.  As part of our continuing mission to provide you with exceptional heart care, we have created designated Provider Care Teams.  These Care Teams include your primary Cardiologist (physician) and Advanced Practice Providers (APPs -  Physician Assistants and Nurse Practitioners) who all work together to provide you with the care you need, when you need it.  We recommend signing up for the patient portal called "MyChart".  Sign up information is provided on this After Visit Summary.  MyChart is used to connect with patients for Virtual Visits (Telemedicine).  Patients are able to view lab/test results, encounter notes, upcoming appointments, etc.  Non-urgent messages can be sent to your provider as well.   To learn more about what you can do with MyChart, go to [email or web address].    Your next appointment:   1 year(s)  The format for your next appointment:   In Person  Provider:   Evalina Field, MD {

## 2022-02-19 ENCOUNTER — Other Ambulatory Visit: Payer: Self-pay | Admitting: Cardiovascular Disease

## 2022-04-19 DIAGNOSIS — R21 Rash and other nonspecific skin eruption: Secondary | ICD-10-CM | POA: Diagnosis not present

## 2022-06-14 DIAGNOSIS — E538 Deficiency of other specified B group vitamins: Secondary | ICD-10-CM | POA: Diagnosis not present

## 2022-06-14 DIAGNOSIS — G4710 Hypersomnia, unspecified: Secondary | ICD-10-CM | POA: Diagnosis not present

## 2022-06-14 DIAGNOSIS — R0609 Other forms of dyspnea: Secondary | ICD-10-CM | POA: Diagnosis not present

## 2022-06-14 DIAGNOSIS — I10 Essential (primary) hypertension: Secondary | ICD-10-CM | POA: Diagnosis not present

## 2022-06-14 DIAGNOSIS — K219 Gastro-esophageal reflux disease without esophagitis: Secondary | ICD-10-CM | POA: Diagnosis not present

## 2022-06-16 ENCOUNTER — Other Ambulatory Visit: Payer: Self-pay | Admitting: Internal Medicine

## 2022-06-30 ENCOUNTER — Ambulatory Visit: Payer: Medicare Other | Admitting: Pulmonary Disease

## 2022-06-30 ENCOUNTER — Encounter: Payer: Self-pay | Admitting: Pulmonary Disease

## 2022-06-30 ENCOUNTER — Ambulatory Visit (INDEPENDENT_AMBULATORY_CARE_PROVIDER_SITE_OTHER): Payer: Medicare Other

## 2022-06-30 VITALS — BP 134/78 | HR 71 | Temp 97.9°F | Ht 70.5 in | Wt 201.4 lb

## 2022-06-30 DIAGNOSIS — R0609 Other forms of dyspnea: Secondary | ICD-10-CM | POA: Diagnosis not present

## 2022-06-30 DIAGNOSIS — R0600 Dyspnea, unspecified: Secondary | ICD-10-CM | POA: Diagnosis not present

## 2022-06-30 NOTE — Patient Instructions (Addendum)
Nice to meet you  We will get a chest x-ray today  We will get pulmonary function test in the coming weeks and a follow-up visit with me afterwards same day to discuss results.  The test takes about 45 minutes to an hour, so this will be a longer visit.  However we can go over the results together and discussed any next steps that need to be taken.  Return to clinic in 6 to 8 weeks with PFT same day, follow with Dr. Silas Flood afterwards

## 2022-06-30 NOTE — Progress Notes (Signed)
$'@Patient'Z$  ID: Roberto George, male    DOB: Dec 21, 1950, 71 y.o.   MRN: [id number]  Chief Complaint  Patient presents with   Consult    Sob,occ cough for about a year. Happens more when laying down     Referring provider: Donald Prose, MD  HPI:   71 y.o. man whom are seen in consultation for evaluation of dyspnea on exertion.  Most recent cardiology note x2  reviewed.  Patient reports dyspnea exertion for about 1 year.  Present on inclines or stairs, long distances flat surfaces.  Sometimes a bit worse when he is lying down.  No time of day when things are better or worse.  No seasonal environmental factors he can identify that make things better or worse.  No other alleviating or exacerbating factors.  Most recent chest x-ray 2016 reveals clear lungs bilaterally, mild review interpretation.  Most recent CT scan of the abdomen and pelvis reveals linear bandlike atelectasis in the lingula, otherwise clear lungs with limited views given abdomen pelvis scan on my review and interpretation.  PMH: CAD status post stent 2020, hyperlipidemia, hypertension Surgical history: Nasal septum surgery, tonsillectomy, adenoidectomy Family history: Brother with CAD, father with CVA Social history: Former limited smoking history, quit early 1970s, lives in Public house manager / Pulmonary Flowsheets:   ACT:      No data to display          MMRC:     No data to display          Epworth:      No data to display          Tests:   FENO:  No results found for: "NITRICOXIDE"  PFT:     No data to display          WALK:      No data to display          Imaging: Personally reviewed and as per EMR and discussion in this note No results found.  Lab Results: Personally reviewed CBC    Component Value Date/Time   WBC 4.5 05/03/2019 1109   WBC 4.4 05/05/2015 1333   RBC 4.57 05/03/2019 1109   RBC 4.56 05/05/2015 1333   HGB 13.4 05/03/2019 1109   HCT  40.2 05/03/2019 1109   PLT 165 05/03/2019 1109   MCV 88 05/03/2019 1109   MCH 29.3 05/03/2019 1109   MCH 28.3 05/05/2015 1333   MCHC 33.3 05/03/2019 1109   MCHC 32.0 05/05/2015 1333   RDW 12.8 05/03/2019 1109    BMET    Component Value Date/Time   NA 141 05/03/2019 1052   K 4.8 05/03/2019 1052   CL 104 05/03/2019 1052   CO2 25 05/03/2019 1052   GLUCOSE 86 05/03/2019 1052   GLUCOSE 81 05/05/2015 1333   BUN 13 05/03/2019 1052   CREATININE 1.17 05/03/2019 1052   CALCIUM 9.7 05/03/2019 1052   GFRNONAA 63 05/03/2019 1052   GFRAA 73 05/03/2019 1052    BNP No results found for: "BNP"  ProBNP No results found for: "PROBNP"  Specialty Problems       Pulmonary Problems   Dyspnea on exertion    Allergies  Allergen Reactions   Erythromycin     Mouth broke out with blisters    Niacin And Related     Flush     Immunization History  Administered Date(s) Administered   Influenza, High Dose Seasonal PF 05/17/2019   PFIZER(Purple Top)SARS-COV-2 Vaccination  09/09/2019, 09/30/2019    Past Medical History:  Diagnosis Date   Allergic rhinitis    Arthritis    CAD (coronary artery disease)    s/p DES to LAD in 04/2019 // Myoview 5/22: EF 48, no ischemia    Complication of anesthesia    slow to wake up   Diverticulosis of colon (without mention of hemorrhage) 06/10/2004   Dr Erskine Emery   GERD (gastroesophageal reflux disease)    Hemorrhoids    History of kidney stones    Hyperlipidemia    Hypertension    Multiple lipomas    back and scalp   Presence of drug coated stent in LAD coronary artery 05/07/2019   Resolut Onyx DES (near ostial prox LAD crossing D1) 2.75 mm x 26 mm - 3.1 mm   Rheumatic fever     Tobacco History: Social History   Tobacco Use  Smoking Status Former   Years: 6.00   Types: Cigarettes   Quit date: 08/15/1970   Years since quitting: 51.9  Smokeless Tobacco Never   Counseling given: Not Answered   Continue to not smoke  Outpatient  Encounter Medications as of 06/30/2022  Medication Sig   amLODipine (NORVASC) 5 MG tablet Take 1 tablet (5 mg total) by mouth daily.   aspirin EC 81 MG tablet Take 81 mg by mouth daily.   atorvastatin (LIPITOR) 40 MG tablet Take 40 mg by mouth daily.   cholecalciferol (VITAMIN D) 1000 UNITS tablet Take 1,000 Units by mouth daily.    cyclobenzaprine (FLEXERIL) 10 MG tablet Take 10 mg by mouth at bedtime as needed for muscle spasms.    Evolocumab (REPATHA SURECLICK) 235 MG/ML SOAJ INJECT 140 MG INTO THE SKIN EVERY 14 (FOURTEEN) DAYS. (Patient taking differently: Once a month)   ezetimibe (ZETIA) 10 MG tablet TAKE 1 TABLET BY MOUTH DAILY   fluticasone (FLONASE) 50 MCG/ACT nasal spray Place 1 spray into both nostrils daily as needed for allergies or rhinitis.   folic acid (FOLVITE) 573 MCG tablet Take 800 mcg by mouth daily.    nitroGLYCERIN (NITROSTAT) 0.4 MG SL tablet Place 1 tablet (0.4 mg total) under the tongue every 5 (five) minutes as needed.   sertraline (ZOLOFT) 50 MG tablet Take 100 mg by mouth at bedtime.   lisinopril (ZESTRIL) 20 MG tablet Take 1 tablet (20 mg total) by mouth daily.   No facility-administered encounter medications on file as of 06/30/2022.     Review of Systems  Review of Systems  No chest pain with exertion.  No orthopnea or PND.  Comprehensive review of systems otherwise negative. Physical Exam  BP 134/78 (BP Location: Right Arm, Cuff Size: Normal)   Pulse 71   Temp 97.9 F (36.6 C) (Oral)   Ht 5' 10.5" (1.791 m)   Wt 201 lb 6.4 oz (91.4 kg)   SpO2 99%   BMI 28.49 kg/m   Wt Readings from Last 5 Encounters:  06/30/22 201 lb 6.4 oz (91.4 kg)  02/11/22 198 lb 9.6 oz (90.1 kg)  01/13/21 200 lb 3.2 oz (90.8 kg)  01/08/21 200 lb (90.7 kg)  09/18/20 200 lb (90.7 kg)    BMI Readings from Last 5 Encounters:  06/30/22 28.49 kg/m  02/11/22 27.70 kg/m  01/13/21 28.32 kg/m  01/08/21 27.89 kg/m  09/18/20 27.89 kg/m     Physical Exam General:  Sitting in chair, no acute distress Eyes: EOMI, no icterus Neck: Supple, no JVP Pulmonary: Clear, normal work of breathing Cardiovascular: Warm, no edema  Abdomen: Nondistended, bowel sounds present MSK: No synovitis, no joint effusion Neuro: Normal gait, no weakness Psych: Normal mood, full affect   Assessment & Plan:   Dyspnea on exertion: Longer walks etc.  Recent cardiac evaluation reassuring.  He has mild anemia that may be contributing.  However, given symptoms additional work-up is warranted.  Chest x-ray today for updated chest imaging.  Pulmonary function test for further evaluation.  He denies atopic symptoms suspicious for asthma etc.  He did smoke in the past albeit about 12 pack years and quit in the mid 1970s.  Coronary artery disease: Status post stenting 2020.  Echocardiogram and stress test 2022 reassuring.  If pulmonary function test and imaging normal, will reach out to cardiologist for further evaluation.   Return in about 8 weeks (around 08/25/2022).   Lanier Clam, MD 06/30/2022   This appointment required 62 minutes of patient care (this includes precharting, chart review, review of results, face-to-face care, etc.).

## 2022-07-04 ENCOUNTER — Telehealth: Payer: Self-pay | Admitting: Pulmonary Disease

## 2022-07-04 NOTE — Telephone Encounter (Signed)
Call report on cxr from 06/30/22:   IMPRESSION: Linear band opacity in the inferior left upper lobe with slightly nodular medial component. While this could represent atelectasis or scarring, a pulmonary nodule cannot be excluded. Follow-up chest CT recommended.     Electronically Signed   By: Ronney Asters M.D.   On: 07/02/2022 01:20  Forwarding to Dr Silas Flood

## 2022-07-04 NOTE — Progress Notes (Signed)
CXR shows linear band or line on left lung - similar to minimal views of the chest on CT abdomen and pelvis in 2021. Will repeat a CXR at next visit. If persists we may need a CT scan of the chest.

## 2022-07-27 DIAGNOSIS — G4733 Obstructive sleep apnea (adult) (pediatric): Secondary | ICD-10-CM | POA: Diagnosis not present

## 2022-07-28 DIAGNOSIS — G4733 Obstructive sleep apnea (adult) (pediatric): Secondary | ICD-10-CM | POA: Diagnosis not present

## 2022-08-22 ENCOUNTER — Ambulatory Visit (INDEPENDENT_AMBULATORY_CARE_PROVIDER_SITE_OTHER): Payer: Medicare Other | Admitting: Pulmonary Disease

## 2022-08-22 ENCOUNTER — Ambulatory Visit: Payer: Medicare Other | Admitting: Pulmonary Disease

## 2022-08-22 ENCOUNTER — Encounter: Payer: Self-pay | Admitting: Pulmonary Disease

## 2022-08-22 ENCOUNTER — Ambulatory Visit (INDEPENDENT_AMBULATORY_CARE_PROVIDER_SITE_OTHER): Payer: Medicare Other

## 2022-08-22 VITALS — BP 110/78 | HR 86 | Temp 98.2°F | Ht 70.0 in | Wt 201.0 lb

## 2022-08-22 DIAGNOSIS — R9389 Abnormal findings on diagnostic imaging of other specified body structures: Secondary | ICD-10-CM

## 2022-08-22 DIAGNOSIS — J9811 Atelectasis: Secondary | ICD-10-CM | POA: Diagnosis not present

## 2022-08-22 DIAGNOSIS — R0609 Other forms of dyspnea: Secondary | ICD-10-CM | POA: Diagnosis not present

## 2022-08-22 LAB — PULMONARY FUNCTION TEST
DL/VA % pred: 90 %
DL/VA: 3.63 ml/min/mmHg/L
DLCO cor % pred: 88 %
DLCO cor: 22.54 ml/min/mmHg
DLCO unc % pred: 88 %
DLCO unc: 22.54 ml/min/mmHg
FEF 25-75 Post: 2.79 L/sec
FEF 25-75 Pre: 2.56 L/sec
FEF2575-%Change-Post: 9 %
FEF2575-%Pred-Post: 120 %
FEF2575-%Pred-Pre: 110 %
FEV1-%Change-Post: 1 %
FEV1-%Pred-Post: 100 %
FEV1-%Pred-Pre: 98 %
FEV1-Post: 3.15 L
FEV1-Pre: 3.11 L
FEV1FVC-%Change-Post: 3 %
FEV1FVC-%Pred-Pre: 105 %
FEV6-%Change-Post: -1 %
FEV6-%Pred-Post: 97 %
FEV6-%Pred-Pre: 98 %
FEV6-Post: 3.94 L
FEV6-Pre: 4 L
FEV6FVC-%Change-Post: 0 %
FEV6FVC-%Pred-Post: 106 %
FEV6FVC-%Pred-Pre: 105 %
FVC-%Change-Post: -1 %
FVC-%Pred-Post: 91 %
FVC-%Pred-Pre: 93 %
FVC-Post: 3.95 L
FVC-Pre: 4.02 L
Post FEV1/FVC ratio: 80 %
Post FEV6/FVC ratio: 100 %
Pre FEV1/FVC ratio: 77 %
Pre FEV6/FVC Ratio: 100 %
RV % pred: 114 %
RV: 2.87 L
TLC % pred: 97 %
TLC: 6.85 L

## 2022-08-22 NOTE — Patient Instructions (Addendum)
29 you again  Your pulmonary function test were all normal, this is very reassuring.  Will get a chest x-ray today to follow-up on mild abnormality back in November.  I think this represents chronic changes on old CT scans but we will double check.  If this abnormality persists we may need to do a dedicated CT scan of the chest but we can discuss over the phone in the coming days.  I will send a message to Dr. Farris Has regarding your symptoms in the light of normal pulmonary function test.  Return to clinic in 3 months or sooner if needed with Dr. Silas Flood

## 2022-08-22 NOTE — Progress Notes (Signed)
$'@Patient'W$  ID: Roberto George, male    DOB: 1951/02/10, 73 y.o.   MRN: [id number]  Chief Complaint  Patient presents with   Follow-up    PFT results    Referring provider: Donald Prose, MD  HPI:   72 y.o. man whom are seeing in follow up  for evaluation of dyspnea on exertion.    Routine follow-up.  PFTs performed today.  Personally reviewed interpret as normal PFTs.  We discussed at length is reassuring.  Discussed chest x-ray at last visit shows linear opacity left side.  This corresponds in my opinion with linear atelectasis seen on CT abdomen pelvis 07/2020.  Discussed repeat chest x-ray if still present pursuing possible CT dedicated chest.  Abnormal PFTs are reassuring, do not have an etiology in the lung but may be causing his symptoms.  HPI at initial visit: Patient reports dyspnea exertion for about 1 year.  Present on inclines or stairs, long distances flat surfaces.  Sometimes a bit worse when he is lying down.  No time of day when things are better or worse.  No seasonal environmental factors he can identify that make things better or worse.  No other alleviating or exacerbating factors.  Most recent chest x-ray 2016 reveals clear lungs bilaterally, on my  review interpretation.  Most recent CT scan of the abdomen and pelvis reveals linear bandlike atelectasis in the lingula, otherwise clear lungs with limited views given abdomen pelvis scan on my review and interpretation.  PMH: CAD status post stent 2020, hyperlipidemia, hypertension Surgical history: Nasal septum surgery, tonsillectomy, adenoidectomy Family history: Brother with CAD, father with CVA Social history: Former limited smoking history, quit early 1970s, [place]    Questionaires / Pulmonary Flowsheets:   ACT:      No data to display          MMRC:     No data to display          Epworth:      No data to display          Tests:   FENO:  No results found for:  "NITRICOXIDE"  PFT:    Latest Ref Rng & Units 08/22/2022   10:42 AM  PFT Results  FVC-Pre L 4.02  P  FVC-Predicted Pre % 93  P  FVC-Post L 3.95  P  FVC-Predicted Post % 91  P  Pre FEV1/FVC % % 77  P  Post FEV1/FCV % % 80  P  FEV1-Pre L 3.11  P  FEV1-Predicted Pre % 98  P  FEV1-Post L 3.15  P  DLCO uncorrected ml/min/mmHg 22.54  P  DLCO UNC% % 88  P  DLCO corrected ml/min/mmHg 22.54  P  DLCO COR %Predicted % 88  P  DLVA Predicted % 90  P  TLC L 6.85  P  TLC % Predicted % 97  P  RV % Predicted % 114  P    P Preliminary result    WALK:      No data to display          Imaging: Personally reviewed and as per EMR and discussion in this note No results found.  Lab Results: Personally reviewed CBC    Component Value Date/Time   WBC 4.5 05/03/2019 1109   WBC 4.4 05/05/2015 1333   RBC 4.57 05/03/2019 1109   RBC 4.56 05/05/2015 1333   HGB 13.4 05/03/2019 1109   HCT 40.2 05/03/2019 1109   PLT 165  05/03/2019 1109   MCV 88 05/03/2019 1109   MCH 29.3 05/03/2019 1109   MCH 28.3 05/05/2015 1333   MCHC 33.3 05/03/2019 1109   MCHC 32.0 05/05/2015 1333   RDW 12.8 05/03/2019 1109    BMET    Component Value Date/Time   NA 141 05/03/2019 1052   K 4.8 05/03/2019 1052   CL 104 05/03/2019 1052   CO2 25 05/03/2019 1052   GLUCOSE 86 05/03/2019 1052   GLUCOSE 81 05/05/2015 1333   BUN 13 05/03/2019 1052   CREATININE 1.17 05/03/2019 1052   CALCIUM 9.7 05/03/2019 1052   GFRNONAA 63 05/03/2019 1052   GFRAA 73 05/03/2019 1052    BNP No results found for: "BNP"  ProBNP No results found for: "PROBNP"  Specialty Problems       Pulmonary Problems   Dyspnea on exertion    Allergies  Allergen Reactions   Erythromycin     Mouth broke out with blisters    Niacin And Related     Flush     Immunization History  Administered Date(s) Administered   Influenza, High Dose Seasonal PF 05/17/2019   PFIZER(Purple Top)SARS-COV-2 Vaccination 09/09/2019, 09/30/2019     Past Medical History:  Diagnosis Date   Allergic rhinitis    Arthritis    CAD (coronary artery disease)    s/p DES to LAD in 04/2019 // Myoview 5/22: EF 48, no ischemia    Complication of anesthesia    slow to wake up   Diverticulosis of colon (without mention of hemorrhage) 06/10/2004   Dr Erskine Emery   GERD (gastroesophageal reflux disease)    Hemorrhoids    History of kidney stones    Hyperlipidemia    Hypertension    Multiple lipomas    back and scalp   Presence of drug coated stent in LAD coronary artery 05/07/2019   Resolut Onyx DES (near ostial prox LAD crossing D1) 2.75 mm x 26 mm - 3.1 mm   Rheumatic fever     Tobacco History: Social History   Tobacco Use  Smoking Status Former   Years: 6.00   Types: Cigarettes   Quit date: 08/15/1970   Years since quitting: 52.0  Smokeless Tobacco Never   Counseling given: Not Answered   Continue to not smoke  Outpatient Encounter Medications as of 08/22/2022  Medication Sig   amLODipine (NORVASC) 5 MG tablet Take 1 tablet (5 mg total) by mouth daily.   aspirin EC 81 MG tablet Take 81 mg by mouth daily.   atorvastatin (LIPITOR) 40 MG tablet Take 40 mg by mouth daily.   cholecalciferol (VITAMIN D) 1000 UNITS tablet Take 1,000 Units by mouth daily.    cyclobenzaprine (FLEXERIL) 10 MG tablet Take 10 mg by mouth at bedtime as needed for muscle spasms.    Evolocumab (REPATHA SURECLICK) 413 MG/ML SOAJ INJECT 140 MG INTO THE SKIN EVERY 14 (FOURTEEN) DAYS. (Patient taking differently: Once a month)   ezetimibe (ZETIA) 10 MG tablet TAKE 1 TABLET BY MOUTH DAILY   fluticasone (FLONASE) 50 MCG/ACT nasal spray Place 1 spray into both nostrils daily as needed for allergies or rhinitis.   folic acid (FOLVITE) 244 MCG tablet Take 800 mcg by mouth daily.    Iron, Ferrous Sulfate, 325 (65 Fe) MG TABS    lisinopril (ZESTRIL) 20 MG tablet Take 1 tablet (20 mg total) by mouth daily.   nitroGLYCERIN (NITROSTAT) 0.4 MG SL tablet Place 1 tablet  (0.4 mg total) under the tongue every 5 (five)  minutes as needed.   sertraline (ZOLOFT) 50 MG tablet Take 100 mg by mouth at bedtime.   No facility-administered encounter medications on file as of 08/22/2022.     Review of Systems  Review of Systems  No chest pain with exertion.  No orthopnea or PND.  Comprehensive review of systems otherwise negative. Physical Exam  BP 110/78 (BP Location: Left Arm, Cuff Size: Normal)   Pulse 86   Temp 98.2 F (36.8 C) (Oral)   Ht '5\' 10"'$  (1.778 m)   Wt 201 lb (91.2 kg)   SpO2 95%   BMI 28.84 kg/m   Wt Readings from Last 5 Encounters:  08/22/22 201 lb (91.2 kg)  06/30/22 201 lb 6.4 oz (91.4 kg)  02/11/22 198 lb 9.6 oz (90.1 kg)  01/13/21 200 lb 3.2 oz (90.8 kg)  01/08/21 200 lb (90.7 kg)    BMI Readings from Last 5 Encounters:  08/22/22 28.84 kg/m  06/30/22 28.49 kg/m  02/11/22 27.70 kg/m  01/13/21 28.32 kg/m  01/08/21 27.89 kg/m     Physical Exam General: Sitting in chair, no acute distress Eyes: EOMI, no icterus Neck: Supple, no JVP Pulmonary: Clear, normal work of breathing Cardiovascular: Warm, no edema Abdomen: Nondistended, bowel sounds present MSK: No synovitis, no joint effusion Neuro: Normal gait, no weakness Psych: Normal mood, full affect   Assessment & Plan:   Dyspnea on exertion: Longer walks etc.  Recent cardiac evaluation 2022 reassuring.  He has mild anemia that may be contributing.  PFTs 08/2022 normal.   Coronary artery disease: Status post stenting 2020.  Echocardiogram and stress test 2022 reassuring.  PFT normal, will reach out to cardiologist for further evaluation re: DOE.   Return in about 3 months (around 11/21/2022).   Lanier Clam, MD 08/22/2022

## 2022-08-22 NOTE — Progress Notes (Signed)
PFT done today. 

## 2022-08-23 NOTE — Progress Notes (Signed)
CXR is improved - no need for additional imaging

## 2022-08-26 DIAGNOSIS — M5136 Other intervertebral disc degeneration, lumbar region: Secondary | ICD-10-CM | POA: Diagnosis not present

## 2022-08-26 DIAGNOSIS — M62838 Other muscle spasm: Secondary | ICD-10-CM | POA: Diagnosis not present

## 2022-08-26 DIAGNOSIS — I700 Atherosclerosis of aorta: Secondary | ICD-10-CM | POA: Diagnosis not present

## 2022-08-26 DIAGNOSIS — G4733 Obstructive sleep apnea (adult) (pediatric): Secondary | ICD-10-CM | POA: Diagnosis not present

## 2022-08-26 DIAGNOSIS — D649 Anemia, unspecified: Secondary | ICD-10-CM | POA: Diagnosis not present

## 2022-08-26 DIAGNOSIS — E782 Mixed hyperlipidemia: Secondary | ICD-10-CM | POA: Diagnosis not present

## 2022-08-26 DIAGNOSIS — I25119 Atherosclerotic heart disease of native coronary artery with unspecified angina pectoris: Secondary | ICD-10-CM | POA: Diagnosis not present

## 2022-08-26 DIAGNOSIS — R5383 Other fatigue: Secondary | ICD-10-CM | POA: Diagnosis not present

## 2022-08-26 DIAGNOSIS — I10 Essential (primary) hypertension: Secondary | ICD-10-CM | POA: Diagnosis not present

## 2022-08-29 DIAGNOSIS — G4733 Obstructive sleep apnea (adult) (pediatric): Secondary | ICD-10-CM | POA: Diagnosis not present

## 2022-08-30 ENCOUNTER — Other Ambulatory Visit: Payer: Self-pay | Admitting: Cardiovascular Disease

## 2022-08-30 DIAGNOSIS — I25119 Atherosclerotic heart disease of native coronary artery with unspecified angina pectoris: Secondary | ICD-10-CM

## 2022-08-30 DIAGNOSIS — E785 Hyperlipidemia, unspecified: Secondary | ICD-10-CM

## 2022-08-31 NOTE — Telephone Encounter (Signed)
Routed to PharmD Pool 

## 2022-09-02 ENCOUNTER — Other Ambulatory Visit (HOSPITAL_COMMUNITY): Payer: Self-pay

## 2022-09-13 DIAGNOSIS — Z961 Presence of intraocular lens: Secondary | ICD-10-CM | POA: Diagnosis not present

## 2022-09-13 DIAGNOSIS — H524 Presbyopia: Secondary | ICD-10-CM | POA: Diagnosis not present

## 2022-09-13 DIAGNOSIS — H0100B Unspecified blepharitis left eye, upper and lower eyelids: Secondary | ICD-10-CM | POA: Diagnosis not present

## 2022-09-13 DIAGNOSIS — H52203 Unspecified astigmatism, bilateral: Secondary | ICD-10-CM | POA: Diagnosis not present

## 2022-09-13 DIAGNOSIS — H35371 Puckering of macula, right eye: Secondary | ICD-10-CM | POA: Diagnosis not present

## 2022-09-13 DIAGNOSIS — G4733 Obstructive sleep apnea (adult) (pediatric): Secondary | ICD-10-CM | POA: Diagnosis not present

## 2022-09-13 DIAGNOSIS — H43813 Vitreous degeneration, bilateral: Secondary | ICD-10-CM | POA: Diagnosis not present

## 2022-09-13 DIAGNOSIS — H0100A Unspecified blepharitis right eye, upper and lower eyelids: Secondary | ICD-10-CM | POA: Diagnosis not present

## 2022-09-14 DIAGNOSIS — G4733 Obstructive sleep apnea (adult) (pediatric): Secondary | ICD-10-CM | POA: Diagnosis not present

## 2022-09-26 DIAGNOSIS — G4733 Obstructive sleep apnea (adult) (pediatric): Secondary | ICD-10-CM | POA: Diagnosis not present

## 2022-09-29 DIAGNOSIS — G4733 Obstructive sleep apnea (adult) (pediatric): Secondary | ICD-10-CM | POA: Diagnosis not present

## 2022-11-07 DIAGNOSIS — G4733 Obstructive sleep apnea (adult) (pediatric): Secondary | ICD-10-CM | POA: Diagnosis not present

## 2022-11-08 DIAGNOSIS — G4733 Obstructive sleep apnea (adult) (pediatric): Secondary | ICD-10-CM | POA: Diagnosis not present

## 2022-12-06 DIAGNOSIS — G4733 Obstructive sleep apnea (adult) (pediatric): Secondary | ICD-10-CM | POA: Diagnosis not present

## 2022-12-19 DIAGNOSIS — M898X9 Other specified disorders of bone, unspecified site: Secondary | ICD-10-CM | POA: Diagnosis not present

## 2023-02-04 ENCOUNTER — Other Ambulatory Visit: Payer: Self-pay | Admitting: Cardiovascular Disease

## 2023-02-24 DIAGNOSIS — K219 Gastro-esophageal reflux disease without esophagitis: Secondary | ICD-10-CM | POA: Diagnosis not present

## 2023-02-24 DIAGNOSIS — I25119 Atherosclerotic heart disease of native coronary artery with unspecified angina pectoris: Secondary | ICD-10-CM | POA: Diagnosis not present

## 2023-02-24 DIAGNOSIS — I700 Atherosclerosis of aorta: Secondary | ICD-10-CM | POA: Diagnosis not present

## 2023-02-24 DIAGNOSIS — Z9181 History of falling: Secondary | ICD-10-CM | POA: Diagnosis not present

## 2023-02-24 DIAGNOSIS — M5136 Other intervertebral disc degeneration, lumbar region: Secondary | ICD-10-CM | POA: Diagnosis not present

## 2023-02-24 DIAGNOSIS — E782 Mixed hyperlipidemia: Secondary | ICD-10-CM | POA: Diagnosis not present

## 2023-02-24 DIAGNOSIS — I10 Essential (primary) hypertension: Secondary | ICD-10-CM | POA: Diagnosis not present

## 2023-02-24 DIAGNOSIS — Z0000 Encounter for general adult medical examination without abnormal findings: Secondary | ICD-10-CM | POA: Diagnosis not present

## 2023-03-02 DIAGNOSIS — D692 Other nonthrombocytopenic purpura: Secondary | ICD-10-CM | POA: Diagnosis not present

## 2023-03-02 DIAGNOSIS — D2262 Melanocytic nevi of left upper limb, including shoulder: Secondary | ICD-10-CM | POA: Diagnosis not present

## 2023-03-02 DIAGNOSIS — L814 Other melanin hyperpigmentation: Secondary | ICD-10-CM | POA: Diagnosis not present

## 2023-03-02 DIAGNOSIS — D1801 Hemangioma of skin and subcutaneous tissue: Secondary | ICD-10-CM | POA: Diagnosis not present

## 2023-03-02 DIAGNOSIS — Z85828 Personal history of other malignant neoplasm of skin: Secondary | ICD-10-CM | POA: Diagnosis not present

## 2023-03-02 DIAGNOSIS — L820 Inflamed seborrheic keratosis: Secondary | ICD-10-CM | POA: Diagnosis not present

## 2023-03-02 DIAGNOSIS — D2272 Melanocytic nevi of left lower limb, including hip: Secondary | ICD-10-CM | POA: Diagnosis not present

## 2023-03-02 DIAGNOSIS — L821 Other seborrheic keratosis: Secondary | ICD-10-CM | POA: Diagnosis not present

## 2023-03-13 ENCOUNTER — Other Ambulatory Visit: Payer: Self-pay | Admitting: Cardiovascular Disease

## 2023-03-15 DIAGNOSIS — H35371 Puckering of macula, right eye: Secondary | ICD-10-CM | POA: Diagnosis not present

## 2023-03-15 DIAGNOSIS — Z961 Presence of intraocular lens: Secondary | ICD-10-CM | POA: Diagnosis not present

## 2023-04-16 DIAGNOSIS — H66001 Acute suppurative otitis media without spontaneous rupture of ear drum, right ear: Secondary | ICD-10-CM | POA: Diagnosis not present

## 2023-04-24 DIAGNOSIS — H6122 Impacted cerumen, left ear: Secondary | ICD-10-CM | POA: Diagnosis not present

## 2023-04-24 DIAGNOSIS — R0981 Nasal congestion: Secondary | ICD-10-CM | POA: Diagnosis not present

## 2023-05-09 DIAGNOSIS — D649 Anemia, unspecified: Secondary | ICD-10-CM | POA: Diagnosis not present

## 2023-05-09 DIAGNOSIS — K219 Gastro-esophageal reflux disease without esophagitis: Secondary | ICD-10-CM | POA: Diagnosis not present

## 2023-05-31 DIAGNOSIS — Z23 Encounter for immunization: Secondary | ICD-10-CM | POA: Diagnosis not present

## 2023-06-05 ENCOUNTER — Telehealth: Payer: Self-pay | Admitting: *Deleted

## 2023-06-05 NOTE — Telephone Encounter (Signed)
Left message to call back to schedule IN OFFICE APPT FOR PRE OP CLEARANCE.

## 2023-06-05 NOTE — Telephone Encounter (Signed)
   Pre-operative Risk Assessment    Patient Name: Roberto George  DOB: 09-14-1950 MRN: [id number]      Request for Surgical Clearance    Procedure:   Endoscopy/Colonoscopy  Date of Surgery:  Clearance 06/13/23                                 Surgeon:  Dr. Willis Modena Surgeon's Group or Practice Name:  [facility] Phone number:  [phone]  Fax number:  [phone]   Type of Clearance Requested:   - Medical  - Pharmacy:  Hold Aspirin Not Indicated.   Type of Anesthesia:   Propofol   Additional requests/questions:    Signed, Emmit Pomfret   06/05/2023, 12:30 PM

## 2023-06-05 NOTE — Telephone Encounter (Signed)
    Primary Cardiologist:[place] Cleophus Molt, MD  Chart reviewed as part of pre-operative protocol coverage. Because of Roberto George past medical history and time since last visit, he/she will require a follow-up visit in order to better assess preoperative cardiovascular risk.  Pre-op covering staff: - Please schedule in office appointment and call patient to inform them. - Please contact requesting surgeon's office via preferred method (i.e, phone, fax) to inform them of need for appointment prior to surgery.  If applicable, this message will also be routed to pharmacy pool and/or primary cardiologist for input on holding anticoagulant/antiplatelet agent as requested below so that this information is available at time of patient's appointment.   Ronney Asters, NP  06/05/2023, 1:08 PM

## 2023-06-05 NOTE — Telephone Encounter (Signed)
Pt has appt 06/06/23 with Azalee Course, PAC for pre op clearance. I will update all parties involved.

## 2023-06-06 ENCOUNTER — Ambulatory Visit: Payer: Medicare Other | Attending: Physician Assistant | Admitting: Physician Assistant

## 2023-06-06 ENCOUNTER — Encounter: Payer: Self-pay | Admitting: Physician Assistant

## 2023-06-06 VITALS — BP 124/74 | HR 73 | Ht 71.0 in | Wt 190.0 lb

## 2023-06-06 DIAGNOSIS — I10 Essential (primary) hypertension: Secondary | ICD-10-CM

## 2023-06-06 DIAGNOSIS — E785 Hyperlipidemia, unspecified: Secondary | ICD-10-CM | POA: Diagnosis not present

## 2023-06-06 DIAGNOSIS — I2089 Other forms of angina pectoris: Secondary | ICD-10-CM

## 2023-06-06 DIAGNOSIS — Z01810 Encounter for preprocedural cardiovascular examination: Secondary | ICD-10-CM | POA: Diagnosis not present

## 2023-06-06 MED ORDER — NITROGLYCERIN 0.4 MG SL SUBL: 0.4000 mg | SUBLINGUAL_TABLET | SUBLINGUAL | 2 refills | Status: AC | PRN

## 2023-06-06 NOTE — Patient Instructions (Signed)
Medication Instructions:  NO CHANGES *If you need a refill on your cardiac medications before your next appointment, please call your pharmacy*   Lab Work: NO LABS If you have labs (blood work) drawn today and your tests are completely normal, you will receive your results only by: MyChart Message (if you have MyChart) OR A paper copy in the mail If you have any lab test that is abnormal or we need to change your treatment, we will call you to review the results.   Testing/Procedures: NO TESTING   Follow-Up: At [facility], you and your health needs are our priority.  As part of our continuing mission to provide you with exceptional heart care, we have created designated Provider Care Teams.  These Care Teams include your primary Cardiologist (physician) and Advanced Practice Providers (APPs -  Physician Assistants and Nurse Practitioners) who all work together to provide you with the care you need, when you need it.   Your next appointment:   1 year(s)  Provider:   Reatha Harps, MD   Other Instructions MONITOR CHEST PAIN, IF SYMPTOMS WORSEN SUCH AS FREQUENCY, DURATION, OR NOTICE EPISODE OCCURS WITH CERTAIN ACTIVITY GIVE OFFICE A CALL.  MAY HOLD ASPIRIN 5 DAYS PRIOR TO PROCEDURE  CLEARED FOR SURGERY.

## 2023-06-06 NOTE — Progress Notes (Unsigned)
Cardiology Office Note:  .   Date:  06/08/2023  ID:  Babs Sciara, DOB 1951-05-23, MRN [id number] PCP: Deatra James, MD  [facility] Providers Cardiologist:  Reatha Harps, MD     History of Present Illness: .   Roberto George is a 72 y.o. male with PMH of CAD, LBBB, HTN and HLD.  Patient had abnormal Myoview in 2020 that led to cardiac catheterization, this showed significant proximal LAD lesion treated with PCI on 05/07/2019, he had normal left circumflex and RCA.  He was last seen by Dr. Flora Lipps in June 2023 at which time he was doing well.  Patient presents today for follow-up.  Over the past year, he had occasional chest discomfort.  He suspect that his chest discomfort is radiating from his neck.  He has significant arthritis in the neck and shoulder.  Chest discomfort never occurs with physical activity and always occurs at rest and is self resolved within a minute.  He denies any shortness of breath with activity.  There has been no increase in frequency, duration or intensity of the chest discomfort.  It occurs several times a month.  We discussed various options, we eventually decided to continue to observe his symptoms.  He will let us know if his symptom began to increase in frequency, duration, intensity or become more associated with physical activity.  He has upcoming colonoscopy/endoscopy by Dr. Dulce Sellar of GI service.  He may hold aspirin for 5 days prior to the procedure and restart as soon as possible afterward at the GI doctor's discretion.  Otherwise, he can follow-up with Dr. Flora Lipps in 1 year.  ROS:   Patient has occasional chest discomfort radiating from his neck.  He denies any shortness of breath with activity.  He has no lower extremity edema, orthopnea or PND.  Studies Reviewed: Marland Kitchen   EKG Interpretation Date/Time:  Tuesday June 06 2023 15:36:03 EDT Ventricular Rate:  63 PR Interval:  206 QRS Duration:  80 QT Interval:  398 QTC Calculation: 407 R  Axis:   47  Text Interpretation: Normal sinus rhythm  No significant ST-T wave changes Confirmed by Azalee Course [phone]) on 06/08/2023 7:49:48 PM    Cardiac Studies & Procedures   CARDIAC CATHETERIZATION  CARDIAC CATHETERIZATION 05/07/2019  Narrative  There is mild left ventricular systolic dysfunction. The left ventricular ejection fraction is 50-55% by visual estimate.  LV end diastolic pressure is normal.  --------  CULPRIT LESIONS: Prox LAD to Mid LAD tandem 70% stenoses with 30% stenosed side branch in 1st Diag.  A drug-eluting stent was successfully placed using a STENT RESOLUTE ONYX 2.75X26. - post-dilated to 3.1 mm  Post intervention, there is a 0% residual stenosis.  Post intervention, the side branch was reduced to 50% residual stenosis. - mild plaque shift (maintained TIMI 3 flow)  ---  Ost LAD to Prox LAD lesion is 30% stenosed.  Otherwise only minimal CAD  SUMMARY  Single-vessel disease involving very proximal LAD with tandem 70% lesions before and after takeoff of 1st Diag with mild involvement of 1st Diag  Successful DES PCI covering both lesions in the LAD: Resolute Onyx DES 2.7 mm x 26 mm (3.1 mm)  Normal LVEF and EDP.   RECOMMENDATIONS  Return to short stay for TR band removal and bed rest.  Anticipate same-day discharge  Continue aggressive risk medication -> lipid, glycemic and blood pressure control  Consider adding beta-blocker in the outpatient setting  Follow-up with Dr. Flora Lipps  Bryan Lemma, MD  Findings Coronary Findings Diagnostic  Dominance: Right  Left Main  Left Anterior Descending Ost LAD to Prox LAD lesion is 30% stenosed. The lesion is distal to major branch and focal. Prox LAD to Mid LAD lesion is 70% stenosed with 30% stenosed side branch in 1st Diag. The lesion is located at the major branch, eccentric and irregular. There actually tandem lesions before and after the branch Mild plaque shifting into the ostial  sidebranch, but TIMI-3 flow noted  First Diagonal Branch Vessel is small in size.  First Septal Branch Vessel is small in size.  Second Diagonal Branch Vessel is small in size.  Second Septal Branch Vessel is small in size.  Third Diagonal Branch Vessel is small in size.  Left Anterior Descending Ost LAD to Prox LAD lesion is 30% stenosed. The lesion is distal to major branch and focal. Prox LAD to Mid LAD lesion is 70% stenosed with 30% stenosed side branch in 1st Diag. The lesion is located at the major branch, eccentric and irregular. There actually tandem lesions before and after the branch Mild plaque shifting into the ostial sidebranch, but TIMI-3 flow noted  First Diagonal Branch Vessel is small in size.  First Septal Branch Vessel is small in size.  Second Diagonal Branch Vessel is small in size.  Second Septal Branch Vessel is small in size.  Third Diagonal Branch Vessel is small in size.  Ramus Intermedius Vessel is normal in caliber. Vessel is angiographically normal.  Left Circumflex Vessel is angiographically normal.  Left Posterior Atrioventricular Artery Vessel is small in size.  Right Coronary Artery Vessel is large. The vessel exhibits minimal luminal irregularities. Borderline ectatic  Acute Marginal Branch Vessel is small in size.  Right Posterior Descending Artery Vessel is moderate in size. Vessel is angiographically normal.  Right Posterior Atrioventricular Artery Vessel is moderate in size. Vessel is angiographically normal.  First Right Posterolateral Branch Vessel is small in size.  Second Right Posterolateral Branch Vessel is small in size.  Third Right Posterolateral Branch Vessel is small in size.  Intervention  Prox LAD to Mid LAD lesion with side branch in 1st Diag Stent - Main Branch Lesion length:  24 mm. CATH VISTA GUIDE 6FR XBLAD3.5 guide catheter was inserted. Lesion crossed with guidewire using a WIRE ASAHI  PROWATER 180CM. Pre-stent angioplasty was performed using a BALLOON EMERGE MR 2.5X20. Maximum pressure:  10 atm. Inflation time:  20 sec. A drug-eluting stent was successfully placed using a STENT RESOLUTE ONYX 2.75X26. Maximum pressure: 18 atm. Inflation time: 30 sec. Minimum lumen area:  3.1 mm. Stent strut is well apposed. Post-stent angioplasty was performed using a BALLOON SAPPHIRE [place] 3.0X15. Maximum pressure:  18 atm. Inflation time:  20 sec. WIRE RUNTHROUGH .[phone] used to protect 1st Diag Post-Intervention Lesion Assessment The intervention was successful. Pre-interventional TIMI flow is 3. Post-intervention TIMI flow is 3. Treated lesion length:  26 mm. No complications occurred at this lesion. Post-PCI EKG showed that the patient developed a mild interventricular conduction delay that had not been there before. There is a 0% residual stenosis in the main branch post intervention. There is a 50% residual stenosis in the side branch post intervention.  Prox LAD to Mid LAD lesion with side branch in 1st Diag Stent - Main Branch See details in Prox LAD to Mid LAD lesion with side branch in 1st Diag. Post-Intervention Lesion Assessment See details in Prox LAD to Mid LAD lesion with side branch in 1st Diag.  There is a 0% residual stenosis in the main branch post intervention. There is a 50% residual stenosis in the side branch post intervention.   STRESS TESTS  MYOCARDIAL PERFUSION IMAGING 01/08/2021  Narrative  Nuclear stress EF: 48%. Septal wall hypokinesis  The left ventricular ejection fraction is mildly decreased (45-54%).  There was no ST segment deviation noted during stress.  Decreased radiotracer uptake in all segments at rest and stress when compared to high uptake in lateral wall. There is not appear to be any significant ischemic reversibility however.  Intermediate risk test. No ischemia identified. No significant change when compared to prior study.    ECHOCARDIOGRAM  ECHOCARDIOGRAM COMPLETE 01/15/2021  Narrative ECHOCARDIOGRAM REPORT    Patient Name:   Roberto George Date of Exam: 01/15/2021 Medical Rec #:  [id number]        Height:       70.5 in Accession #:    [id number]       Weight:       200.2 lb Date of Birth:  May 30, 1951        BSA:          2.099 m Patient Age:    69 years         BP:           124/70 mmHg Patient Gender: M                HR:           66 bpm. Exam Location:  [address]  Procedure: 2D Echo, 3D Echo, Cardiac Doppler and Color Doppler  Indications:    R06.02 Shortness of Breath  History:        Patient has prior history of Echocardiogram examinations, most recent 04/19/2019. CAD, Signs/Symptoms:Shortness of Breath; Risk Factors:Hypertension, Dyslipidemia, Family History of Coronary Artery Disease and Former Smoker. History of Rheumatic Fever.  Sonographer:    Farrel Conners RDCS Referring Phys: [id number] Ronnald Ramp O'NEAL  IMPRESSIONS   1. Left ventricular ejection fraction, by estimation, is 50 to 55%. The left ventricle has low normal function. The left ventricle has no regional wall motion abnormalities. Left ventricular diastolic parameters are consistent with Grade I diastolic dysfunction (impaired relaxation). 2. There is mild dilatation of the ascending aorta, measuring 38 mm.  FINDINGS Left Ventricle: Left ventricular ejection fraction, by estimation, is 50 to 55%. The left ventricle has low normal function. The left ventricle has no regional wall motion abnormalities. The left ventricular internal cavity size was normal in size. There is no left ventricular hypertrophy. Left ventricular diastolic parameters are consistent with Grade I diastolic dysfunction (impaired relaxation).  Right Ventricle: The right ventricular size is normal. No increase in right ventricular wall thickness. Right ventricular systolic function is normal. There is normal pulmonary artery systolic pressure. The  tricuspid regurgitant velocity is 2.47 m/s, and with an assumed right atrial pressure of 3 mmHg, the estimated right ventricular systolic pressure is 27.4 mmHg.  Left Atrium: Left atrial size was normal in size.  Right Atrium: Right atrial size was normal in size.  Pericardium: There is no evidence of pericardial effusion.  Mitral Valve: The mitral valve is normal in structure. No evidence of mitral valve regurgitation. No evidence of mitral valve stenosis.  Tricuspid Valve: The tricuspid valve is normal in structure. Tricuspid valve regurgitation is mild . No evidence of tricuspid stenosis.  Aortic Valve: The aortic valve is normal in structure. Aortic valve regurgitation is not visualized. No aortic stenosis is  present.  Pulmonic Valve: The pulmonic valve was normal in structure. Pulmonic valve regurgitation is not visualized. No evidence of pulmonic stenosis.  Aorta: The aortic root is normal in size and structure. There is mild dilatation of the ascending aorta, measuring 38 mm.  Venous: The inferior vena cava is normal in size with greater than 50% respiratory variability, suggesting right atrial pressure of 3 mmHg.  IAS/Shunts: No atrial level shunt detected by color flow Doppler.   LEFT VENTRICLE PLAX 2D LVIDd:         4.82 cm  Diastology LVIDs:         3.20 cm  LV e' medial:    7.18 cm/s LV PW:         1.02 cm  LV E/e' medial:  8.5 LV IVS:        0.63 cm  LV e' lateral:   8.59 cm/s LVOT diam:     2.20 cm  LV E/e' lateral: 7.1 LV SV:         96 LV SV Index:   46 LVOT Area:     3.80 cm  3D Volume EF: 3D EF:        58 % LV EDV:       173 ml LV ESV:       73 ml LV SV:        100 ml  RIGHT VENTRICLE RV S prime:     12.60 cm/s TAPSE (M-mode): 2.1 cm  LEFT ATRIUM             Index       RIGHT ATRIUM           Index LA diam:        4.00 cm 1.91 cm/m  RA Area:     17.00 cm LA Vol (A2C):   52.8 ml 25.15 ml/m RA Volume:   43.70 ml  20.82 ml/m LA Vol (A4C):   54.0 ml  25.72 ml/m LA Biplane Vol: 54.0 ml 25.72 ml/m AORTIC VALVE LVOT Vmax:   140.00 cm/s LVOT Vmean:  74.100 cm/s LVOT VTI:    0.253 m  AORTA Ao Root diam: 3.50 cm Ao Asc diam:  3.80 cm  MITRAL VALVE               TRICUSPID VALVE MV Area (PHT): cm         TR Peak grad:   24.4 mmHg MV Decel Time: 336 msec    TR Vmax:        247.00 cm/s MV E velocity: 61.10 cm/s MV A velocity: 56.75 cm/s  SHUNTS MV E/A ratio:  1.08        Systemic VTI:  0.25 m Systemic Diam: 2.20 cm  Belva Crome MD Electronically signed by Belva Crome MD Signature Date/Time: 01/15/2021/12:23:24 PM    Final    MONITORS  CARDIAC EVENT MONITOR 05/02/2016  Narrative Sinus rhythm with isolated PVCs Palpitations often correlated with isolated PVCs           Risk Assessment/Calculations:            Physical Exam:   VS:  BP 124/74 (BP Location: Left Arm, Patient Position: Sitting, Cuff Size: Normal)   Pulse 73   Ht 5\' 11"  (1.803 m)   Wt 190 lb (86.2 kg)   SpO2 95%   BMI 26.50 kg/m    Wt Readings from Last 3 Encounters:  06/06/23 190 lb (86.2 kg)  08/22/22 201 lb (91.2 kg)  06/30/22  201 lb 6.4 oz (91.4 kg)    GEN: Well nourished, well developed in no acute distress NECK: No JVD; No carotid bruits CARDIAC: RRR, no murmurs, rubs, gallops RESPIRATORY:  Clear to auscultation without rales, wheezing or rhonchi  ABDOMEN: Soft, non-tender, non-distended EXTREMITIES:  No edema; No deformity   ASSESSMENT AND PLAN: .    Preoperative clearance: Patient has upcoming GI procedure.  This is a low risk procedure.  Patient is at acceptable risk to proceed from the cardiac perspective.  If needed, he may hold aspirin for 5 days prior to the procedure and restart as soon as possible afterward at his GI doctor's discretion.  Atypical chest pain: He has been having some atypical chest pain that is radiating from the neck.  He suspect is related to his neck pain.  He has significant arthritis and often has neck pain  and shoulder pain.  Symptom does not happen with physical activity.  It has shown no sign of increasing frequency, duration or intensity.  He will continue to monitor and notify cardiology service if he has worsening symptoms.  Hypertension: Blood pressure stable  Hyperlipidemia: On Lipitor.       Dispo: Follow-up with Dr. Flora Lipps in 1 year  Signed, Azalee Course, [place]

## 2023-06-06 NOTE — Telephone Encounter (Signed)
   Patient Name: Roberto George  DOB: 1951/04/07 MRN: [id number]  Primary Cardiologist: Reatha Harps, MD  Chart reviewed as part of pre-operative protocol coverage. Given past medical history and time since last visit, based on ACC/AHA guidelines, MAGNUM FLUCK is at acceptable risk for the planned procedure without further cardiovascular testing.  Patient was seen in the cardiology office on 06/06/2023 at which time he is stable from the cardiac perspective.  If needed, he may hold aspirin for 5 days prior to the procedure and restart as soon as possible afterward at the his GI doctor's discretion.  I will route this recommendation to the requesting party via Epic fax function and remove from pre-op pool.  Please call with questions.  [place], [place] 06/06/2023, 4:13 PM

## 2023-06-13 DIAGNOSIS — K648 Other hemorrhoids: Secondary | ICD-10-CM | POA: Diagnosis not present

## 2023-06-13 DIAGNOSIS — Z860101 Personal history of adenomatous and serrated colon polyps: Secondary | ICD-10-CM | POA: Diagnosis not present

## 2023-06-13 DIAGNOSIS — D122 Benign neoplasm of ascending colon: Secondary | ICD-10-CM | POA: Diagnosis not present

## 2023-06-13 DIAGNOSIS — Z09 Encounter for follow-up examination after completed treatment for conditions other than malignant neoplasm: Secondary | ICD-10-CM | POA: Diagnosis not present

## 2023-06-13 DIAGNOSIS — K317 Polyp of stomach and duodenum: Secondary | ICD-10-CM | POA: Diagnosis not present

## 2023-06-13 DIAGNOSIS — D649 Anemia, unspecified: Secondary | ICD-10-CM | POA: Diagnosis not present

## 2023-06-13 DIAGNOSIS — D123 Benign neoplasm of transverse colon: Secondary | ICD-10-CM | POA: Diagnosis not present

## 2023-06-15 DIAGNOSIS — D123 Benign neoplasm of transverse colon: Secondary | ICD-10-CM | POA: Diagnosis not present

## 2023-06-15 DIAGNOSIS — D122 Benign neoplasm of ascending colon: Secondary | ICD-10-CM | POA: Diagnosis not present

## 2023-06-15 DIAGNOSIS — K317 Polyp of stomach and duodenum: Secondary | ICD-10-CM | POA: Diagnosis not present

## 2023-06-18 ENCOUNTER — Other Ambulatory Visit: Payer: Self-pay | Admitting: Cardiovascular Disease

## 2023-07-27 DIAGNOSIS — B356 Tinea cruris: Secondary | ICD-10-CM | POA: Diagnosis not present

## 2023-08-28 DIAGNOSIS — I10 Essential (primary) hypertension: Secondary | ICD-10-CM | POA: Diagnosis not present

## 2023-08-28 DIAGNOSIS — I700 Atherosclerosis of aorta: Secondary | ICD-10-CM | POA: Diagnosis not present

## 2023-08-28 DIAGNOSIS — L304 Erythema intertrigo: Secondary | ICD-10-CM | POA: Diagnosis not present

## 2023-08-28 DIAGNOSIS — M62838 Other muscle spasm: Secondary | ICD-10-CM | POA: Diagnosis not present

## 2023-08-28 DIAGNOSIS — E782 Mixed hyperlipidemia: Secondary | ICD-10-CM | POA: Diagnosis not present

## 2023-08-28 DIAGNOSIS — M5136 Other intervertebral disc degeneration, lumbar region with discogenic back pain only: Secondary | ICD-10-CM | POA: Diagnosis not present

## 2023-08-28 DIAGNOSIS — I25119 Atherosclerotic heart disease of native coronary artery with unspecified angina pectoris: Secondary | ICD-10-CM | POA: Diagnosis not present

## 2023-08-28 DIAGNOSIS — J309 Allergic rhinitis, unspecified: Secondary | ICD-10-CM | POA: Diagnosis not present

## 2023-09-07 DIAGNOSIS — M1712 Unilateral primary osteoarthritis, left knee: Secondary | ICD-10-CM | POA: Diagnosis not present

## 2023-09-07 DIAGNOSIS — M1711 Unilateral primary osteoarthritis, right knee: Secondary | ICD-10-CM | POA: Diagnosis not present

## 2023-09-07 DIAGNOSIS — M170 Bilateral primary osteoarthritis of knee: Secondary | ICD-10-CM | POA: Diagnosis not present

## 2023-09-12 ENCOUNTER — Other Ambulatory Visit: Payer: Self-pay | Admitting: Cardiovascular Disease

## 2023-09-21 DIAGNOSIS — H0100B Unspecified blepharitis left eye, upper and lower eyelids: Secondary | ICD-10-CM | POA: Diagnosis not present

## 2023-09-21 DIAGNOSIS — H35371 Puckering of macula, right eye: Secondary | ICD-10-CM | POA: Diagnosis not present

## 2023-09-21 DIAGNOSIS — H43813 Vitreous degeneration, bilateral: Secondary | ICD-10-CM | POA: Diagnosis not present

## 2023-09-21 DIAGNOSIS — Z961 Presence of intraocular lens: Secondary | ICD-10-CM | POA: Diagnosis not present

## 2023-09-21 DIAGNOSIS — H52203 Unspecified astigmatism, bilateral: Secondary | ICD-10-CM | POA: Diagnosis not present

## 2023-09-21 DIAGNOSIS — H524 Presbyopia: Secondary | ICD-10-CM | POA: Diagnosis not present

## 2023-09-21 DIAGNOSIS — H0100A Unspecified blepharitis right eye, upper and lower eyelids: Secondary | ICD-10-CM | POA: Diagnosis not present

## 2023-11-08 ENCOUNTER — Other Ambulatory Visit: Payer: Self-pay | Admitting: Cardiovascular Disease

## 2023-11-08 DIAGNOSIS — I25119 Atherosclerotic heart disease of native coronary artery with unspecified angina pectoris: Secondary | ICD-10-CM

## 2023-11-08 DIAGNOSIS — E785 Hyperlipidemia, unspecified: Secondary | ICD-10-CM

## 2024-02-12 DIAGNOSIS — I10 Essential (primary) hypertension: Secondary | ICD-10-CM | POA: Diagnosis not present

## 2024-02-12 DIAGNOSIS — I25119 Atherosclerotic heart disease of native coronary artery with unspecified angina pectoris: Secondary | ICD-10-CM | POA: Diagnosis not present

## 2024-03-14 DIAGNOSIS — I10 Essential (primary) hypertension: Secondary | ICD-10-CM | POA: Diagnosis not present

## 2024-03-14 DIAGNOSIS — I25119 Atherosclerotic heart disease of native coronary artery with unspecified angina pectoris: Secondary | ICD-10-CM | POA: Diagnosis not present

## 2024-03-26 DIAGNOSIS — I25119 Atherosclerotic heart disease of native coronary artery with unspecified angina pectoris: Secondary | ICD-10-CM | POA: Diagnosis not present

## 2024-03-26 DIAGNOSIS — E782 Mixed hyperlipidemia: Secondary | ICD-10-CM | POA: Diagnosis not present

## 2024-03-26 DIAGNOSIS — M542 Cervicalgia: Secondary | ICD-10-CM | POA: Diagnosis not present

## 2024-03-26 DIAGNOSIS — I10 Essential (primary) hypertension: Secondary | ICD-10-CM | POA: Diagnosis not present

## 2024-03-26 DIAGNOSIS — Z7289 Other problems related to lifestyle: Secondary | ICD-10-CM | POA: Diagnosis not present

## 2024-03-26 DIAGNOSIS — Z0000 Encounter for general adult medical examination without abnormal findings: Secondary | ICD-10-CM | POA: Diagnosis not present

## 2024-03-26 DIAGNOSIS — M51369 Other intervertebral disc degeneration, lumbar region without mention of lumbar back pain or lower extremity pain: Secondary | ICD-10-CM | POA: Diagnosis not present

## 2024-03-26 DIAGNOSIS — K219 Gastro-esophageal reflux disease without esophagitis: Secondary | ICD-10-CM | POA: Diagnosis not present

## 2024-04-14 DIAGNOSIS — I10 Essential (primary) hypertension: Secondary | ICD-10-CM | POA: Diagnosis not present

## 2024-04-14 DIAGNOSIS — I25119 Atherosclerotic heart disease of native coronary artery with unspecified angina pectoris: Secondary | ICD-10-CM | POA: Diagnosis not present

## 2024-05-14 DIAGNOSIS — I10 Essential (primary) hypertension: Secondary | ICD-10-CM | POA: Diagnosis not present

## 2024-05-14 DIAGNOSIS — I25119 Atherosclerotic heart disease of native coronary artery with unspecified angina pectoris: Secondary | ICD-10-CM | POA: Diagnosis not present

## 2024-05-23 DIAGNOSIS — Z85828 Personal history of other malignant neoplasm of skin: Secondary | ICD-10-CM | POA: Diagnosis not present

## 2024-05-23 DIAGNOSIS — L821 Other seborrheic keratosis: Secondary | ICD-10-CM | POA: Diagnosis not present

## 2024-05-23 DIAGNOSIS — L814 Other melanin hyperpigmentation: Secondary | ICD-10-CM | POA: Diagnosis not present

## 2024-05-23 DIAGNOSIS — D1801 Hemangioma of skin and subcutaneous tissue: Secondary | ICD-10-CM | POA: Diagnosis not present

## 2024-05-23 DIAGNOSIS — D2262 Melanocytic nevi of left upper limb, including shoulder: Secondary | ICD-10-CM | POA: Diagnosis not present

## 2024-05-23 DIAGNOSIS — D692 Other nonthrombocytopenic purpura: Secondary | ICD-10-CM | POA: Diagnosis not present

## 2024-05-23 DIAGNOSIS — D225 Melanocytic nevi of trunk: Secondary | ICD-10-CM | POA: Diagnosis not present

## 2024-06-18 NOTE — Progress Notes (Unsigned)
 Cardiology Office Note:  .   Date:  06/20/2024  ID:  Roberto George, DOB 1951/03/26, MRN [id number] PCP: George, Vyvyan, MD  [facility] Providers Cardiologist:  Roberto ONEIDA Decent, MD  History of Present Illness: .    Chief Complaint  Patient presents with   Chest Pain    Roberto George is a 73 y.o. male with history of CAD, LBBB, HTN, HLD who presents for follow-up.    History of Present Illness   Roberto George is a 73 year old male with coronary artery disease, left bundle branch block, and hypertension, HLD who presents for follow-up.  He experiences infrequent episodes of sharp chest pain over the past year, occurring approximately once every week or two. The pain can occur on either side of the chest, typically lasting less than a minute, and often occurs while he is sitting and feeling stressed. He has not used nitroglycerin  for these episodes, although he carries it with him.  He underwent percutaneous coronary intervention (PCI) to the proximal left anterior descending (LAD) artery in 2020. He has a history of left bundle branch block and hypertension, which is currently managed with lisinopril  20 mg daily and amlodipine  5 mg daily. He adjusted the timing of his lisinopril  intake to nighttime to avoid dizziness experienced during the day.  He feels tired, sleeping about 11 hours a day, and experiences shortness of breath. He is not currently exercising due to arthritis-related hip pain that occurs after walking for several minutes. He has received injections and nerve ablation for back and neck pain.  He is currently taking Lipitor, Zetia , and Repatha  for lipid management, with his cholesterol levels reportedly well-controlled. He was previously on aspirin  but experienced bruising and bleeding. He notes that he bruises easily and bleeds if he gets a cut.  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           Problem List 1. CAD -PCI to proximal LAD 05/07/2019 -normal LCX/RCA -after positive stress test  2. LBBB 3. HTN 4. HLD -T chol 101, HDL 48, LDL 36, TG 85    ROS: All other ROS reviewed and negative. Pertinent positives noted in the HPI.     Studies Reviewed: SABRA   EKG Interpretation Date/Time:  Thursday June 20 2024 09:22:18 EST Ventricular Rate:  71 PR Interval:  198 QRS Duration:  66 QT Interval:  370 QTC Calculation: 402 R Axis:   53  Text Interpretation: Normal sinus rhythm Nonspecific ST abnormality Confirmed by George Roberto [id number]) on 06/20/2024 9:29:00 AM   TTE 01/15/2021  1. Left ventricular ejection fraction, by estimation, is 50 to 55%. The  left ventricle has low normal function. The left ventricle has no regional  wall motion abnormalities. Left ventricular diastolic parameters are  consistent with Grade I diastolic  dysfunction (impaired relaxation).   2. There is mild dilatation of the ascending aorta, measuring 38 mm.  Physical Exam:   VS:  BP 122/70 (BP Location: Left Arm, Patient Position: Sitting, Cuff Size: Normal)   Pulse 71   Ht 5' 11 (1.803 m)   Wt 198 lb 9.6 oz (90.1 kg)   SpO2 95%   BMI 27.70 kg/m    Wt Readings from Last 3 Encounters:  06/20/24 198 lb 9.6 oz (90.1 kg)  06/06/23 190 lb (86.2 kg)  08/22/22 201 lb (  91.2 kg)    GEN: Well nourished, well developed in no acute distress NECK: No JVD; No carotid bruits CARDIAC: RRR, no murmurs, rubs, gallops RESPIRATORY:  Clear to auscultation without rales, wheezing or rhonchi  ABDOMEN: Soft, non-tender, non-distended EXTREMITIES:  No edema; No deformity  ASSESSMENT AND PLAN: .   Assessment and Plan    Coronary artery disease status post PCI to proximal LAD with infrequent chest pain Infrequent sharp chest pain likely non-cardiac, but further evaluation needed due to stent location. - Ordered nuclear stress  test. - Ordered echocardiogram. - Encouraged low-impact exercise such as water aerobics.  Hypertension, well controlled Hypertension stable on lisinopril  and amlodipine . Dizziness resolved with nighttime dosing of lisinopril . - Continue lisinopril  20 mg daily. - Continue amlodipine  5 mg daily.  Hyperlipidemia, well controlled on statin, ezetimibe , and PCSK9 inhibitor Hyperlipidemia well controlled with current regimen. LDL at 36. - Continue Lipitor 40 mg daily. - Continue Zetia  10 mg daily. - Continue Repatha  monthly.  History of left bundle branch block Left bundle branch block asymptomatic, likely rate-related. - Continue monitoring with echocardiogram.          Informed Consent   Shared Decision Making/Informed Consent The risks [chest pain, shortness of breath, cardiac arrhythmias, dizziness, blood pressure fluctuations, myocardial infarction, stroke/transient ischemic attack, nausea, vomiting, allergic reaction, radiation exposure, metallic taste sensation and life-threatening complications (estimated to be 1 in 10,000)], benefits (risk stratification, diagnosing coronary artery disease, treatment guidance) and alternatives of a nuclear stress test were discussed in detail with Mr. Runnion and he agrees to proceed.      Follow-up: Return in about 1 year (around 06/20/2025).  Signed, Roberto George. Barbaraann, MD, [facility]  [facility]  [address] [place], [place] [address] [phone]  9:42 AM

## 2024-06-20 ENCOUNTER — Encounter: Payer: Self-pay | Admitting: Cardiovascular Disease

## 2024-06-20 ENCOUNTER — Ambulatory Visit: Attending: Cardiovascular Disease | Admitting: Cardiovascular Disease

## 2024-06-20 VITALS — BP 122/70 | HR 71 | Ht 71.0 in | Wt 198.6 lb

## 2024-06-20 DIAGNOSIS — I10 Essential (primary) hypertension: Secondary | ICD-10-CM

## 2024-06-20 DIAGNOSIS — R0602 Shortness of breath: Secondary | ICD-10-CM

## 2024-06-20 DIAGNOSIS — E785 Hyperlipidemia, unspecified: Secondary | ICD-10-CM | POA: Diagnosis not present

## 2024-06-20 DIAGNOSIS — I25119 Atherosclerotic heart disease of native coronary artery with unspecified angina pectoris: Secondary | ICD-10-CM

## 2024-06-20 DIAGNOSIS — R072 Precordial pain: Secondary | ICD-10-CM | POA: Diagnosis not present

## 2024-06-20 MED ORDER — CLOPIDOGREL BISULFATE 75 MG PO TABS: 75.0000 mg | ORAL_TABLET | Freq: Every day | ORAL | 3 refills | Status: AC

## 2024-06-20 NOTE — Patient Instructions (Signed)
 Medication Instructions:  Your physician has recommended you make the following change in your medication:  STOP: Aspirin   START: Plavix  75 mg once daily *If you need a refill on your cardiac medications before your next appointment, please call your pharmacy*   Testing/Procedures: Your physician has requested that you have an echocardiogram. Echocardiography is a painless test that uses sound waves to create images of your heart. It provides your doctor with information about the size and shape of your heart and how well your heart's chambers and valves are working. This procedure takes approximately one hour. There are no restrictions for this procedure. Please do NOT wear cologne, perfume, aftershave, or lotions (deodorant is allowed). Please arrive 15 minutes prior to your appointment time.  Please note: We ask at that you not bring children with you during ultrasound (echo/ vascular) testing. Due to room size and safety concerns, children are not allowed in the ultrasound rooms during exams. Our front office staff cannot provide observation of children in our lobby area while testing is being conducted. An adult accompanying a patient to their appointment will only be allowed in the ultrasound room at the discretion of the ultrasound technician under special circumstances. We apologize for any inconvenience.  Your physician has requested that you have a lexiscan  myoview .   The test will take approximately 3 to 4 hours to complete; you may bring reading material.  If someone comes with you to your appointment, they will need to remain in the main lobby due to limited space in the testing area. **If you are pregnant or breastfeeding, please notify the nuclear lab prior to your appointment**  How to prepare for your Myocardial Perfusion Test: Do not eat or drink 3 hours prior to your test, except you may have water. Do not consume products containing caffeine (regular or decaffeinated) 12  hours prior to your test. (ex: coffee, chocolate, sodas, tea). Do bring a list of your current medications with you.  If not listed below, you may take your medications as normal. Do wear comfortable clothes (no dresses or overalls) and walking shoes, tennis shoes preferred (No heels or open toe shoes are allowed). Do NOT wear cologne, perfume, aftershave, or lotions (deodorant is allowed). If these instructions are not followed, your test will have to be rescheduled.   Follow-Up: At [facility], you and your health needs are our priority.  As part of our continuing mission to provide you with exceptional heart care, our providers are all part of one team.  This team includes your primary Cardiologist (physician) and Advanced Practice Providers or APPs (Physician Assistants and Nurse Practitioners) who all work together to provide you with the care you need, when you need it.  Your next appointment:   1 year(s)  Provider:   One of our Advanced Practice Providers (APPs): Morse Clause, PA-C  Lamarr Satterfield, NP Miriam Shams, NP  Olivia Pavy, PA-C Josefa Beauvais, NP  Leontine Salen, PA-C Orren Fabry, PA-C  Pattison, PA-C Ernest Dick, NP  Damien Braver, NP Jon Hails, PA-C  Waddell Donath, PA-C    Dayna Dunn, PA-C  Scott Weaver, PA-C Lum Louis, NP Katlyn West, NP Callie Goodrich, PA-C  Xika Zhao, NP Sheng Haley, PA-C    Kathleen Johnson, PA-C

## 2024-06-22 ENCOUNTER — Other Ambulatory Visit: Payer: Self-pay | Admitting: Cardiovascular Disease

## 2024-07-26 ENCOUNTER — Other Ambulatory Visit: Payer: Self-pay | Admitting: Cardiovascular Disease

## 2024-07-26 DIAGNOSIS — R072 Precordial pain: Secondary | ICD-10-CM

## 2024-07-29 ENCOUNTER — Telehealth (HOSPITAL_COMMUNITY): Payer: Self-pay | Admitting: *Deleted

## 2024-07-29 NOTE — Telephone Encounter (Signed)
 Patient given detailed instructions per Myocardial Perfusion Study Information Sheet for the test on 08/01/23 Patient notified to arrive 15 minutes early and that it is imperative to arrive on time for appointment to keep from having the test rescheduled.  If you need to cancel or reschedule your appointment, please call the office within 24 hours of your appointment. . Patient verbalized understanding.Claudene Ronal Quale, RN

## 2024-07-31 ENCOUNTER — Ambulatory Visit (HOSPITAL_COMMUNITY): Admission: RE | Admit: 2024-07-31 | Discharge: 2024-07-31 | Attending: Cardiology | Admitting: Cardiology

## 2024-07-31 ENCOUNTER — Ambulatory Visit: Payer: Self-pay | Admitting: Cardiovascular Disease

## 2024-07-31 ENCOUNTER — Ambulatory Visit (HOSPITAL_BASED_OUTPATIENT_CLINIC_OR_DEPARTMENT_OTHER)
Admission: RE | Admit: 2024-07-31 | Discharge: 2024-07-31 | Disposition: A | Discharge status: Home / Self Care | Source: Ambulatory Visit | Attending: Cardiovascular Disease | Admitting: Cardiovascular Disease

## 2024-07-31 DIAGNOSIS — I10 Essential (primary) hypertension: Secondary | ICD-10-CM | POA: Diagnosis not present

## 2024-07-31 DIAGNOSIS — I2510 Atherosclerotic heart disease of native coronary artery without angina pectoris: Secondary | ICD-10-CM | POA: Diagnosis not present

## 2024-07-31 DIAGNOSIS — R072 Precordial pain: Secondary | ICD-10-CM

## 2024-07-31 DIAGNOSIS — R079 Chest pain, unspecified: Secondary | ICD-10-CM

## 2024-07-31 LAB — MYOCARDIAL PERFUSION IMAGING
LV dias vol: 112 mL (ref 62–150)
LV sys vol: 34 mL (ref 4.2–5.8)
Nuc Stress EF: 70 %
Peak HR: 96 {beats}/min
Rest HR: 67 {beats}/min
Rest Nuclear Isotope Dose: 10.6 mCi
SDS: 0
SRS: 3
SSS: 1
ST Depression (mm): 0 mm
Stress Nuclear Isotope Dose: 31.4 mCi
TID: 1.07

## 2024-07-31 LAB — ECHOCARDIOGRAM COMPLETE
Area-P 1/2: 3.48 cm2
S' Lateral: 2.7 cm

## 2024-07-31 MED ORDER — REGADENOSON 0.4 MG/5ML IV SOLN
0.4000 mg | Freq: Once | INTRAVENOUS | Status: AC
  Administered 2024-07-31: 09:00:00 0.4 mg via INTRAVENOUS

## 2024-07-31 MED ORDER — TECHNETIUM TC 99M TETROFOSMIN IV KIT
31.4000 | PACK | Freq: Once | INTRAVENOUS | Status: AC | PRN
  Administered 2024-07-31: 09:00:00 31.4 via INTRAVENOUS

## 2024-07-31 MED ORDER — TECHNETIUM TC 99M TETROFOSMIN IV KIT
10.6000 | PACK | Freq: Once | INTRAVENOUS | Status: AC | PRN
  Administered 2024-07-31: 08:00:00 10.6 via INTRAVENOUS

## 2024-07-31 MED ORDER — REGADENOSON 0.4 MG/5ML IV SOLN
INTRAVENOUS | Status: AC
  Filled 2024-07-31: qty 5

## 2024-08-23 ENCOUNTER — Telehealth: Payer: Self-pay | Admitting: Cardiovascular Disease

## 2024-08-23 NOTE — Telephone Encounter (Signed)
 Pt c/o medication issue:  1. Name of Medication: clopidogrel  (PLAVIX ) 75 MG tablet   2. How are you currently taking this medication (dosage and times per day)?  As written  3. Are you having a reaction (difficulty breathing--STAT)? No  4. What is your medication issue? Pt states that for the last 10 days medication has been causing hm to have heart burn really bad. He would like a c/b regarding this matter. Please advise

## 2024-08-25 ENCOUNTER — Other Ambulatory Visit: Payer: Self-pay | Admitting: Cardiovascular Disease

## 2024-08-25 DIAGNOSIS — E785 Hyperlipidemia, unspecified: Secondary | ICD-10-CM

## 2024-08-25 DIAGNOSIS — I25119 Atherosclerotic heart disease of native coronary artery with unspecified angina pectoris: Secondary | ICD-10-CM

## 2024-08-26 NOTE — Telephone Encounter (Signed)
 Left message for patient to call back. Protonix  order pending. Will order once pt confirms Pharmacy to send to.

## 2024-08-29 MED ORDER — PANTOPRAZOLE SODIUM 40 MG PO TBEC: 40.0000 mg | DELAYED_RELEASE_TABLET | Freq: Every day | ORAL | 3 refills | Status: AC

## 2024-08-29 NOTE — Telephone Encounter (Signed)
 Barbaraann Darryle Ned, MD to [facility]     08/26/24  8:32 AM Add protonix  40 mg daily.    Signed, Darryle DASEN. Barbaraann, MD, [facility]  [facility]  [address] [place], [place] [address] [phone]  8:32 AM  Spoke with pt regarding Dr. Rosslyn recommendations. Prescription sent to pt's preferred pharmacy. Pt verbalizes understanding.
